# Patient Record
Sex: Female | Born: 1960 | Race: Black or African American | Hispanic: No | Marital: Married | Smoking: Former smoker
Health system: Southern US, Community
[De-identification: ages and names within clinical notes are randomized; demographics above are authoritative.]

## PROBLEM LIST (undated history)

## (undated) DIAGNOSIS — IMO0002 Reserved for concepts with insufficient information to code with codable children: Secondary | ICD-10-CM

## (undated) DIAGNOSIS — E049 Nontoxic goiter, unspecified: Secondary | ICD-10-CM

## (undated) DIAGNOSIS — N762 Acute vulvitis: Secondary | ICD-10-CM

## (undated) DIAGNOSIS — N898 Other specified noninflammatory disorders of vagina: Secondary | ICD-10-CM

## (undated) DIAGNOSIS — N3090 Cystitis, unspecified without hematuria: Secondary | ICD-10-CM

## (undated) DIAGNOSIS — B379 Candidiasis, unspecified: Secondary | ICD-10-CM

## (undated) DIAGNOSIS — K645 Perianal venous thrombosis: Secondary | ICD-10-CM

## (undated) DIAGNOSIS — D219 Benign neoplasm of connective and other soft tissue, unspecified: Secondary | ICD-10-CM

## (undated) DIAGNOSIS — Z87898 Personal history of other specified conditions: Secondary | ICD-10-CM

## (undated) DIAGNOSIS — Z8619 Personal history of other infectious and parasitic diseases: Secondary | ICD-10-CM

## (undated) HISTORY — DX: Cystitis, unspecified without hematuria: N30.90

## (undated) HISTORY — DX: Candidiasis, unspecified: B37.9

## (undated) HISTORY — DX: Benign neoplasm of connective and other soft tissue, unspecified: D21.9

## (undated) HISTORY — DX: Perianal venous thrombosis: K64.5

## (undated) HISTORY — DX: Acute vulvitis: N76.2

## (undated) HISTORY — DX: Personal history of other specified conditions: Z87.898

## (undated) HISTORY — DX: Reserved for concepts with insufficient information to code with codable children: IMO0002

## (undated) HISTORY — DX: Other specified noninflammatory disorders of vagina: N89.8

## (undated) HISTORY — DX: Nontoxic goiter, unspecified: E04.9

## (undated) HISTORY — DX: Personal history of other infectious and parasitic diseases: Z86.19

---
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
## 1985-08-15 DIAGNOSIS — K645 Perianal venous thrombosis: Secondary | ICD-10-CM

## 1985-08-15 HISTORY — DX: Perianal venous thrombosis: K64.5

## 2001-05-11 ENCOUNTER — Emergency Department (HOSPITAL_COMMUNITY): Admission: EM | Admit: 2001-05-11 | Discharge: 2001-05-12 | Payer: Self-pay | Admitting: Emergency Medicine

## 2001-05-12 ENCOUNTER — Encounter: Payer: Self-pay | Admitting: Emergency Medicine

## 2005-04-01 ENCOUNTER — Encounter: Admission: RE | Admit: 2005-04-01 | Discharge: 2005-04-01 | Payer: Self-pay | Admitting: Obstetrics and Gynecology

## 2005-04-01 ENCOUNTER — Other Ambulatory Visit: Admission: RE | Admit: 2005-04-01 | Discharge: 2005-04-01 | Payer: Self-pay | Admitting: Obstetrics and Gynecology

## 2006-01-12 ENCOUNTER — Emergency Department (HOSPITAL_COMMUNITY): Admission: EM | Admit: 2006-01-12 | Discharge: 2006-01-12 | Payer: Self-pay | Admitting: Emergency Medicine

## 2006-01-14 ENCOUNTER — Ambulatory Visit: Payer: Self-pay | Admitting: Family Medicine

## 2006-01-20 ENCOUNTER — Ambulatory Visit: Payer: Self-pay | Admitting: Family Medicine

## 2006-01-28 ENCOUNTER — Encounter: Admission: RE | Admit: 2006-01-28 | Discharge: 2006-04-28 | Payer: Self-pay | Admitting: Family Medicine

## 2006-01-29 ENCOUNTER — Ambulatory Visit: Payer: Self-pay | Admitting: Family Medicine

## 2006-04-02 ENCOUNTER — Encounter: Admission: RE | Admit: 2006-04-02 | Discharge: 2006-04-02 | Payer: Self-pay | Admitting: Obstetrics and Gynecology

## 2006-04-08 ENCOUNTER — Encounter: Admission: RE | Admit: 2006-04-08 | Discharge: 2006-04-08 | Payer: Self-pay | Admitting: Obstetrics and Gynecology

## 2007-06-02 ENCOUNTER — Encounter: Admission: RE | Admit: 2007-06-02 | Discharge: 2007-06-02 | Payer: Self-pay | Admitting: Endocrinology

## 2008-06-02 ENCOUNTER — Encounter: Admission: RE | Admit: 2008-06-02 | Discharge: 2008-06-02 | Payer: Self-pay | Admitting: Endocrinology

## 2008-07-10 DIAGNOSIS — N898 Other specified noninflammatory disorders of vagina: Secondary | ICD-10-CM

## 2008-07-10 DIAGNOSIS — B379 Candidiasis, unspecified: Secondary | ICD-10-CM

## 2008-07-10 HISTORY — DX: Other specified noninflammatory disorders of vagina: N89.8

## 2008-07-10 HISTORY — DX: Candidiasis, unspecified: B37.9

## 2008-08-01 DIAGNOSIS — N762 Acute vulvitis: Secondary | ICD-10-CM

## 2008-08-01 HISTORY — DX: Acute vulvitis: N76.2

## 2009-12-31 ENCOUNTER — Encounter: Admission: RE | Admit: 2009-12-31 | Discharge: 2009-12-31 | Payer: Self-pay | Admitting: Endocrinology

## 2010-12-11 ENCOUNTER — Other Ambulatory Visit: Payer: Self-pay | Admitting: Internal Medicine

## 2010-12-11 DIAGNOSIS — Z1239 Encounter for other screening for malignant neoplasm of breast: Secondary | ICD-10-CM

## 2011-01-01 ENCOUNTER — Ambulatory Visit
Admission: RE | Admit: 2011-01-01 | Discharge: 2011-01-01 | Disposition: A | Discharge status: Home / Self Care | Payer: 59 | Source: Ambulatory Visit | Attending: Internal Medicine | Admitting: Internal Medicine

## 2011-01-01 DIAGNOSIS — Z1239 Encounter for other screening for malignant neoplasm of breast: Secondary | ICD-10-CM

## 2012-01-08 ENCOUNTER — Ambulatory Visit: Payer: 59 | Admitting: Obstetrics and Gynecology

## 2012-01-08 ENCOUNTER — Ambulatory Visit: Payer: Self-pay | Admitting: Obstetrics and Gynecology

## 2012-03-10 ENCOUNTER — Telehealth: Payer: Self-pay | Admitting: Obstetrics and Gynecology

## 2012-03-10 DIAGNOSIS — N63 Unspecified lump in unspecified breast: Secondary | ICD-10-CM

## 2012-03-10 NOTE — Telephone Encounter (Signed)
Routed to chandra 

## 2012-03-11 NOTE — Telephone Encounter (Signed)
Tc to pt per telephone call. Pt c/o swelling of the left breast(? Lump). No nipple d/c. No c/o right breast. Pt due for a mammogram. Order sent to [facility] for a dx mammogram per vph. Pt voices understanding.

## 2012-03-11 NOTE — Telephone Encounter (Signed)
Lm on vm to cb per telephone call.  

## 2012-03-16 ENCOUNTER — Other Ambulatory Visit: Payer: Self-pay | Admitting: Obstetrics and Gynecology

## 2012-03-16 ENCOUNTER — Ambulatory Visit
Admission: RE | Admit: 2012-03-16 | Discharge: 2012-03-16 | Disposition: A | Discharge status: Home / Self Care | Payer: Managed Care, Other (non HMO) | Source: Ambulatory Visit | Attending: Obstetrics and Gynecology | Admitting: Obstetrics and Gynecology

## 2012-03-16 DIAGNOSIS — N63 Unspecified lump in unspecified breast: Secondary | ICD-10-CM

## 2012-05-11 ENCOUNTER — Encounter: Payer: Self-pay | Admitting: Obstetrics and Gynecology

## 2012-05-11 ENCOUNTER — Ambulatory Visit (INDEPENDENT_AMBULATORY_CARE_PROVIDER_SITE_OTHER): Payer: Commercial Indemnity | Admitting: Obstetrics and Gynecology

## 2012-05-11 VITALS — BP 116/66 | Ht 67.75 in | Wt 232.0 lb

## 2012-05-11 DIAGNOSIS — E119 Type 2 diabetes mellitus without complications: Secondary | ICD-10-CM

## 2012-05-11 DIAGNOSIS — E049 Nontoxic goiter, unspecified: Secondary | ICD-10-CM | POA: Insufficient documentation

## 2012-05-11 DIAGNOSIS — N926 Irregular menstruation, unspecified: Secondary | ICD-10-CM | POA: Insufficient documentation

## 2012-05-11 DIAGNOSIS — N979 Female infertility, unspecified: Secondary | ICD-10-CM | POA: Insufficient documentation

## 2012-05-11 MED ORDER — MISOPROSTOL 200 MCG PO TABS: ORAL_TABLET | ORAL | Status: DC

## 2012-05-11 NOTE — Progress Notes (Signed)
AEX  Last Pap: 07/10/2010 WNL: Yes Regular Periods: no.  Some cycles as close as 14 days apart.  Not heavy, just too often Contraception: None   Monthly Breast exam:no Tetanus<32yrs:yes Nl.Bladder Function:yes Daily BMs:yes Healthy Diet:no Calcium:no Mammogram:yes Date of Mammogram: 04/2012 WNL Exercise:yes Have often Exercise: 5 times weekly  Seatbelt: yes Abuse at home: no Stressful work:no Sigmoid-colonoscopy: None Bone Density: No PCP: None Change in PMH: None Change in [facility]: None   Subjective:    Alicia Murray is a 51 y.o. female, G0P0000, who presents for an annual exam. She c/o menstrual irregularity    History   Social History  . Marital Status: Married    Spouse Name: N/A    Number of Children: N/A  . Years of Education: N/A   Social History Main Topics  . Smoking status: Never Smoker   . Smokeless tobacco: Never Used  . Alcohol Use: Yes  . Drug Use: No  . Sexually Active: Yes    Birth Control/ Protection: None   Other Topics Concern  . None   Social History Narrative  . None    Menstrual cycle:   LMP: Patient's last menstrual period was 05/01/2012.           Cycle: 2 year hx of menses q 21-28 days but in last year they have been closer and lasting longer  The following portions of the patient's history were reviewed and updated as appropriate: allergies, current medications, past family history, past medical history, past social history, past surgical history and problem list.  Review of Systems Pertinent items are noted in HPI. Breast:Negative for breast lump,nipple discharge or nipple retraction Gastrointestinal: Negative for abdominal pain, change in bowel habits or rectal bleeding Urinary:negative   Objective:    BP 116/66  Ht 5' 7.75" (1.721 m)  Wt 232 lb (105.235 kg)  BMI 35.54 kg/m2  LMP 05/01/2012    Weight:  Wt Readings from Last 1 Encounters:  05/11/12 232 lb (105.235 kg)          BMI: Body mass index is 35.54  kg/(m^2).  General Appearance: Alert, appropriate appearance for age. No acute distress HEENT: Grossly normal Neck / Thyroid: Supple, no masses, nodes or enlargement Lungs: clear to auscultation bilaterally Back: No CVA tenderness Breast Exam: No masses or nodes.No dimpling, nipple retraction or discharge. Cardiovascular: Regular rate and rhythm. S1, S2, no murmur Gastrointestinal: Soft, non-tender, no masses or organomegaly Pelvic Exam: External genitalia: Right buttock healed incision s/p anal fissure repair Vaginal: atrophic mucosa Cervix: atrophic with stenotic os Adnexa: non palpable Uterus: irregular enlargement and posterior with decreased mobility.   Exam limited by body habitus and uterine size cannont be definitely determined Rectovaginal: normal rectal, no masses Lymphatic Exam: Non-palpable nodes in neck, clavicular, axillary, or inguinal regions Skin: no rash or abnormalities Neurologic: Normal gait and speech, no tremor  Psychiatric: Alert and oriented, appropriate affect.   Wet Prep:not applicable Urinalysis:not applicable UPT: Not done   Assessment:    abnormal menses, possibly perimenopausal  Fibroids, recurrent s/p myomectomy Primary infertility   Plan:    pap smear SH[facility] and endo bx STD screening: declined Contraception:no method ever      Alicia Murray PMD

## 2012-05-12 LAB — PAP IG AND HPV HIGH-RISK: HPV DNA High Risk: NOT DETECTED

## 2012-06-08 ENCOUNTER — Other Ambulatory Visit: Payer: Self-pay | Admitting: Obstetrics and Gynecology

## 2012-06-08 DIAGNOSIS — D219 Benign neoplasm of connective and other soft tissue, unspecified: Secondary | ICD-10-CM

## 2012-06-08 DIAGNOSIS — N926 Irregular menstruation, unspecified: Secondary | ICD-10-CM

## 2012-06-09 ENCOUNTER — Ambulatory Visit (INDEPENDENT_AMBULATORY_CARE_PROVIDER_SITE_OTHER): Payer: Commercial Indemnity | Admitting: Obstetrics and Gynecology

## 2012-06-09 ENCOUNTER — Ambulatory Visit (INDEPENDENT_AMBULATORY_CARE_PROVIDER_SITE_OTHER): Payer: Commercial Indemnity

## 2012-06-09 ENCOUNTER — Other Ambulatory Visit: Payer: Self-pay | Admitting: Obstetrics and Gynecology

## 2012-06-09 ENCOUNTER — Encounter: Payer: Self-pay | Admitting: Obstetrics and Gynecology

## 2012-06-09 VITALS — BP 124/78 | Temp 98.2°F | Wt 233.0 lb

## 2012-06-09 DIAGNOSIS — N926 Irregular menstruation, unspecified: Secondary | ICD-10-CM

## 2012-06-09 DIAGNOSIS — D219 Benign neoplasm of connective and other soft tissue, unspecified: Secondary | ICD-10-CM

## 2012-06-09 DIAGNOSIS — D259 Leiomyoma of uterus, unspecified: Secondary | ICD-10-CM

## 2012-06-09 NOTE — Progress Notes (Signed)
GYN PROBLEM VISIT  Alicia Murray is a 51 y.o. year old female,G0P0000, who presents to followup irregular bleeding from her last visit on May 11, 2012  Subjective:see Last visit  Objective:  BP 124/78  Temp 98.2 F (36.8 C)  Wt 233 lb (105.688 kg)  LMP 05/31/2012   SONOHYSTEROGRAM  Indications for the procedure, risks and benefits have been reviewed.  Questions were answered.   A permit has been signed.  An ultrasound was performed.   The uterus measured 8.56 cm x 7.05 cm.   The endometrial thickness was 6.91 mm.   The ovaries are normal.   PROCEDURE The vagina and cervix were prepped with Betadine.  The sonohysterogram catheter was placed inside the uterus.  20 cc of sterile saline were injected.  A 3-D ultrasound was performed.  Findings include: Anteverted uterus, normal endometrium, normal ovaries, no fluid in CDS, normal adnexas. Fibroids: 1)Fundal intramural 2)Fundal intramural 3)Anterior intramural 4)Posterior intramural  5) Left subserosal  Fibroids No endometrial lesions are seen. Endometrial cavity with equals 3.2 cm length equals 4.6 cm The patient tolerated her procedure well.  All instruments were removed.  The patient was returned to the supine position.  ENDOMETRIAL BIOPSY  LMP: 05/31/12 UPT: Negative  Ultrasound findings: SEE ABOVE Risks and benefits have been discussed  PROCEDURE Cervix prepped with betadine Tenaculum applied to anterior lip of the cervix: yes Uterus sounded at  10  cm Endometrial biopsy easily performed with Pipelle Well tolerated Specimen appropriately identified and sent to pathology  Assessment: Menorrhagia Intermenstrual bleeding Uterine fibroid No endometrial lesion Needs contraception  Plan: Endometrial biopsy performed. Based on results, patient may be offered Mirena IUD, endometrial ablation or definitive surgery with hysterectomy. I would favor Mirena IUD Follow-up: to be determined based on  results   Dierdre Forth,   06/09/2012 6:53 PM

## 2012-06-11 LAB — PATHOLOGY

## 2012-06-16 ENCOUNTER — Telehealth: Payer: Self-pay

## 2012-06-16 NOTE — Telephone Encounter (Signed)
Tc to pt per test results. Told pt [id number] . Pt given recs per vph of tx per last OV;however pt opts to monitor @ this time. Pt will call back if decides to proceed with anything further. Will make vph aware. Pt agrees.

## 2013-07-04 ENCOUNTER — Other Ambulatory Visit: Payer: Self-pay | Admitting: Internal Medicine

## 2013-07-04 DIAGNOSIS — Z1231 Encounter for screening mammogram for malignant neoplasm of breast: Secondary | ICD-10-CM

## 2013-07-21 ENCOUNTER — Ambulatory Visit
Admission: RE | Admit: 2013-07-21 | Discharge: 2013-07-21 | Disposition: A | Discharge status: Home / Self Care | Payer: 59 | Source: Ambulatory Visit | Attending: Internal Medicine | Admitting: Internal Medicine

## 2013-07-21 DIAGNOSIS — Z1231 Encounter for screening mammogram for malignant neoplasm of breast: Secondary | ICD-10-CM

## 2013-07-26 ENCOUNTER — Other Ambulatory Visit: Payer: Self-pay | Admitting: Internal Medicine

## 2013-07-26 DIAGNOSIS — R928 Other abnormal and inconclusive findings on diagnostic imaging of breast: Secondary | ICD-10-CM

## 2013-08-01 ENCOUNTER — Ambulatory Visit
Admission: RE | Admit: 2013-08-01 | Discharge: 2013-08-01 | Disposition: A | Discharge status: Home / Self Care | Payer: 59 | Source: Ambulatory Visit | Attending: Internal Medicine | Admitting: Internal Medicine

## 2013-08-01 ENCOUNTER — Other Ambulatory Visit: Payer: Self-pay | Admitting: Internal Medicine

## 2013-08-01 DIAGNOSIS — R928 Other abnormal and inconclusive findings on diagnostic imaging of breast: Secondary | ICD-10-CM

## 2013-08-08 ENCOUNTER — Other Ambulatory Visit: Payer: Self-pay | Admitting: *Deleted

## 2013-08-08 ENCOUNTER — Ambulatory Visit
Admission: RE | Admit: 2013-08-08 | Discharge: 2013-08-08 | Disposition: A | Discharge status: Home / Self Care | Payer: 59 | Source: Ambulatory Visit | Attending: Internal Medicine | Admitting: Internal Medicine

## 2013-08-08 DIAGNOSIS — E049 Nontoxic goiter, unspecified: Secondary | ICD-10-CM

## 2013-08-08 DIAGNOSIS — E119 Type 2 diabetes mellitus without complications: Secondary | ICD-10-CM

## 2013-08-08 DIAGNOSIS — R928 Other abnormal and inconclusive findings on diagnostic imaging of breast: Secondary | ICD-10-CM

## 2013-08-08 HISTORY — PX: BREAST BIOPSY: SHX20

## 2013-08-10 ENCOUNTER — Other Ambulatory Visit: Payer: Self-pay | Admitting: Endocrinology

## 2013-08-10 DIAGNOSIS — E785 Hyperlipidemia, unspecified: Secondary | ICD-10-CM

## 2013-08-11 ENCOUNTER — Other Ambulatory Visit (INDEPENDENT_AMBULATORY_CARE_PROVIDER_SITE_OTHER): Payer: 59

## 2013-08-11 DIAGNOSIS — E785 Hyperlipidemia, unspecified: Secondary | ICD-10-CM

## 2013-08-11 DIAGNOSIS — E049 Nontoxic goiter, unspecified: Secondary | ICD-10-CM

## 2013-08-11 DIAGNOSIS — E119 Type 2 diabetes mellitus without complications: Secondary | ICD-10-CM

## 2013-08-11 LAB — URINALYSIS
Bilirubin Urine: NEGATIVE
Hgb urine dipstick: NEGATIVE
Ketones, ur: NEGATIVE
Leukocytes, UA: NEGATIVE
Nitrite: NEGATIVE
Specific Gravity, Urine: 1.025 (ref 1.000–1.030)
Total Protein, Urine: NEGATIVE
Urine Glucose: NEGATIVE
Urobilinogen, UA: 0.2 (ref 0.0–1.0)
pH: 6 (ref 5.0–8.0)

## 2013-08-11 LAB — MICROALBUMIN / CREATININE URINE RATIO
Creatinine,U: 135.7 mg/dL
Microalb Creat Ratio: 1.8 mg/g (ref 0.0–30.0)
Microalb, Ur: 2.4 mg/dL — ABNORMAL HIGH (ref 0.0–1.9)

## 2013-08-11 LAB — HEMOGLOBIN A1C: Hgb A1c MFr Bld: 6.3 % (ref 4.6–6.5)

## 2013-08-12 LAB — COMPREHENSIVE METABOLIC PANEL
ALT: 28 U/L (ref 0–35)
AST: 33 U/L (ref 0–37)
Albumin: 4.1 g/dL (ref 3.5–5.2)
Alkaline Phosphatase: 41 U/L (ref 39–117)
BUN: 15 mg/dL (ref 6–23)
CO2: 28 mEq/L (ref 19–32)
Calcium: 9 mg/dL (ref 8.4–10.5)
Chloride: 105 mEq/L (ref 96–112)
Creatinine, Ser: 0.8 mg/dL (ref 0.4–1.2)
GFR: 93.18 mL/min (ref >60.00)
Glucose, Bld: 85 mg/dL (ref 70–99)
Potassium: 4 mEq/L (ref 3.5–5.1)
Sodium: 138 mEq/L (ref 135–145)
Total Bilirubin: 0.5 mg/dL (ref 0.3–1.2)
Total Protein: 7.4 g/dL (ref 6.0–8.3)

## 2013-08-12 LAB — LIPID PANEL
Cholesterol: 141 mg/dL (ref 0–200)
HDL: 46.1 mg/dL (ref >39.00)
LDL Cholesterol: 71 mg/dL (ref 0–99)
Total CHOL/HDL Ratio: 3
Triglycerides: 122 mg/dL (ref 0.0–149.0)
VLDL: 24.4 mg/dL (ref 0.0–40.0)

## 2013-08-12 LAB — TSH: TSH: 1.48 u[IU]/mL (ref 0.35–5.50)

## 2013-08-12 LAB — T4, FREE: Free T4: 0.91 ng/dL (ref 0.60–1.60)

## 2013-08-15 ENCOUNTER — Encounter: Payer: Self-pay | Admitting: Endocrinology

## 2013-08-15 ENCOUNTER — Ambulatory Visit (INDEPENDENT_AMBULATORY_CARE_PROVIDER_SITE_OTHER): Payer: 59 | Admitting: Endocrinology

## 2013-08-15 VITALS — BP 124/70 | HR 61 | Temp 98.4°F | Resp 12 | Ht 68.0 in | Wt 218.0 lb

## 2013-08-15 DIAGNOSIS — E119 Type 2 diabetes mellitus without complications: Secondary | ICD-10-CM | POA: Insufficient documentation

## 2013-08-15 DIAGNOSIS — E785 Hyperlipidemia, unspecified: Secondary | ICD-10-CM | POA: Insufficient documentation

## 2013-08-15 DIAGNOSIS — E1059 Type 1 diabetes mellitus with other circulatory complications: Secondary | ICD-10-CM | POA: Insufficient documentation

## 2013-08-15 DIAGNOSIS — E1051 Type 1 diabetes mellitus with diabetic peripheral angiopathy without gangrene: Secondary | ICD-10-CM | POA: Insufficient documentation

## 2013-08-15 NOTE — Patient Instructions (Signed)
Weight loss meds Qsymia or Belviq  Please check blood sugars at least half the time about 2 hours after any meal and as directed on waking up. Please bring blood sugar monitor to each visit  Exercise daily

## 2013-08-15 NOTE — Progress Notes (Signed)
Patient ID: Alicia Murray, female   DOB: 05/08/1961, 52 y.o.   MRN: [id number]  Alicia Murray is an 52 y.o. female.   Reason for Appointment: Diabetes follow-up   History of Present Illness   Diagnosis: Type 2 DIABETES MELITUS, date of diagnosis:  2007     Previous history: She had significant hyperglycemia diagnosis and was treated with insulin and metformin. However with efforts to lose weight she was able to be treated without medications for sometime. She currently was started on metformin but because of her progressive weight gain she was switched to Victoza with good success and has been on this since 2/14. Currently detailed records are not available  Recent history: Her blood sugars appear to be fairly well controlled even though she has not been exercising at all or trying to watch her diet. She likes to eat sweets and does not watch what she eats although she does try to keep her portions small and is able to keep her weight relatively stable on Victoza. 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          Blood Glucose readings not available Hypoglycemia frequency:  none        Meals: 3 meals per day.          Physical activity: exercise: none                  Complications: are: None  The last HbgA1c was 5.9  Wt Readings from Last 3 Encounters:  08/15/13 218 lb (98.884 kg)  06/09/12 233 lb (105.688 kg)  05/11/12 232 lb (105.235 kg)    LABS:  Appointment on 08/11/2013  Component Date Value Range Status  . Hemoglobin A1C 08/11/2013 6.3  4.6 - 6.5 % Final   Glycemic Control Guidelines for People with Diabetes:Non Diabetic:  <6%Goal of Therapy: <7%Additional Action Suggested:  >8%   . Sodium 08/11/2013 138  135 - 145 mEq/L Final  . Potassium  08/11/2013 4.0  3.5 - 5.1 mEq/L Final  . Chloride 08/11/2013 105  96 - 112 mEq/L Final  . CO2 08/11/2013 28  19 - 32 mEq/L Final  . Glucose, Bld 08/11/2013 85  70 - 99 mg/dL Final  . BUN 40/98/1191 15  6 - 23 mg/dL Final  . Creatinine, Ser 08/11/2013 0.8  0.4 - 1.2 mg/dL Final  . Total Bilirubin 08/11/2013 0.5  0.3 - 1.2 mg/dL Final  . Alkaline Phosphatase 08/11/2013 41  39 - 117 U/L Final  . AST 08/11/2013 33  0 - 37 U/L Final  . ALT 08/11/2013 28  0 - 35 U/L Final  . Total Protein 08/11/2013 7.4  6.0 - 8.3 g/dL Final  . Albumin 47/82/9562 4.1  3.5 - 5.2 g/dL Final  . Calcium 13/06/6577 9.0  8.4 - 10.5 mg/dL Final  . GFR 46/96/2952 93.18  >60.00 mL/min Final  . Microalb, Ur 08/11/2013 2.4* 0.0 - 1.9 mg/dL Final  . Creatinine,U 84/13/2440 135.7   Final  . Microalb Creat Ratio 08/11/2013 1.8  0.0 - 30.0 mg/g Final  . Color, Urine 08/11/2013 LT. YELLOW  Yellow;Lt. Yellow Final  . APPearance 08/11/2013 CLEAR  Clear Final  . Specific Gravity, Urine 08/11/2013 1.025  1.000-1.030 Final  .  pH 08/11/2013 6.0  5.0 - 8.0 Final  . Total Protein, Urine 08/11/2013 NEGATIVE  Negative Final  . Urine Glucose 08/11/2013 NEGATIVE  Negative Final  . Ketones, ur 08/11/2013 NEGATIVE  Negative Final  . Bilirubin Urine 08/11/2013 NEGATIVE  Negative Final  . Hgb urine dipstick 08/11/2013 NEGATIVE  Negative Final  . Urobilinogen, UA 08/11/2013 0.2  0.0 - 1.0 Final  . Leukocytes, UA 08/11/2013 NEGATIVE  Negative Final  . Nitrite 08/11/2013 NEGATIVE  Negative Final  . Free T4 08/11/2013 0.91  0.60 - 1.60 ng/dL Final  . TSH 95/28/4132 1.48  0.35 - 5.50 uIU/mL Final  . Cholesterol 08/11/2013 141  0 - 200 mg/dL Final   ATP III Classification       Desirable:  < 200 mg/dL               Borderline High:  200 - 239 mg/dL          High:  > = 440 mg/dL  . Triglycerides 08/11/2013 122.0  0.0 - 149.0 mg/dL Final   Normal:  <102 mg/dLBorderline High:  150 - 199 mg/dL  . HDL 08/11/2013 46.10  >39.00 mg/dL Final  . VLDL  72/53/6644 24.4  0.0 - 40.0 mg/dL Final  . LDL Cholesterol 08/11/2013 71  0 - 99 mg/dL Final  . Total CHOL/HDL Ratio 08/11/2013 3   Final                  Men          Women1/2 Average Risk     3.4          3.3Average Risk          5.0          4.42X Average Risk          9.6          7.13X Average Risk          15.0          11.0                          Medication List       This list is accurate as of: 08/15/13  9:48 PM.  Always use your most recent med list.               aspirin 325 MG tablet  Take 325 mg by mouth daily.     atorvastatin 20 MG tablet  Commonly known as:  LIPITOR  Take 10 mg by mouth daily.     niacin 500 MG tablet  Commonly known as:  SLO-NIACIN  Take 500 mg by mouth 2 (two) times daily with a meal.     VICTOZA 18 MG/3ML Sopn  Generic drug:  Liraglutide  Inject 1.2 mg into the skin daily.        Allergies: No Known Allergies  Past Medical History  Diagnosis Date  . Thrombosed external hemorrhoid 08/15/85  . H/O varicella   . History of measles, mumps, or rubella   . Diabetes mellitus   . Bladder infection   . Fibroid   . Monilia infection 07/10/08  . Vagina itching 07/10/08  . Vulvitis 08/01/08  . Infertility   . Thyroid enlargement     Past Surgical History  Procedure Laterality Date  . Myomectomy abdominal approach  1977    Family History  Problem Relation Age of Onset  . Cancer Mother  Breast  . Cancer Father     Brain  . Hypertension Brother     Social History:  reports that she has never smoked. She has never used smokeless tobacco. She reports that  drinks alcohol. She reports that she does not use illicit drugs.  Review of Systems:  Hypertension:  has not been treated for this  Lipids: Has had diabetic dyslipidemia with increased particle number and triglycerides, has been on combination with statins and niacin which she is taking regularly now      Examination:   BP 124/70  Pulse 61  Temp(Src) 98.4 F (36.9 C)   Resp 12  Ht 5\' 8"  (1.727 m)  Wt 218 lb (98.884 kg)  BMI 33.15 kg/m2  SpO2 98%  LMP 05/24/2013  Body mass index is 33.15 kg/(m^2).    ASSESSMENT/ PLAN::   Diabetes type 2   Blood glucose control appears fairly good with upper normal A1c and good lab glucose. However she has poor motivation to follow her diet, check her blood sugars or get back into exercise. Will continue her regimen unchanged Rarely her PCP is planning to put her on a weight-loss medication but not clear which one. Discussed that she could go off her Victoza if she is able to get back into exercise, better diet compliance and start losing weight  Hyperlipidemia: She has been compliant with her medications now with using generics and lipids appear excellent. Will continue the same regimen   Lenox Bink 08/15/2013, 9:48 PM

## 2013-11-17 ENCOUNTER — Other Ambulatory Visit: Payer: Self-pay | Admitting: *Deleted

## 2013-11-17 MED ORDER — ATORVASTATIN CALCIUM 20 MG PO TABS: 10.0000 mg | ORAL_TABLET | Freq: Every day | ORAL | Status: DC

## 2013-12-14 ENCOUNTER — Other Ambulatory Visit (INDEPENDENT_AMBULATORY_CARE_PROVIDER_SITE_OTHER): Payer: 59

## 2013-12-14 DIAGNOSIS — E119 Type 2 diabetes mellitus without complications: Secondary | ICD-10-CM

## 2013-12-14 LAB — BASIC METABOLIC PANEL
BUN: 14 mg/dL (ref 6–23)
CO2: 27 mEq/L (ref 19–32)
Calcium: 8.8 mg/dL (ref 8.4–10.5)
Chloride: 102 mEq/L (ref 96–112)
Creatinine, Ser: 0.8 mg/dL (ref 0.4–1.2)
GFR: 94.36 mL/min (ref >60.00)
Glucose, Bld: 82 mg/dL (ref 70–99)
Potassium: 3.9 mEq/L (ref 3.5–5.1)
Sodium: 136 mEq/L (ref 135–145)

## 2013-12-14 LAB — HEMOGLOBIN A1C: Hgb A1c MFr Bld: 6 % (ref 4.6–6.5)

## 2013-12-19 ENCOUNTER — Ambulatory Visit (INDEPENDENT_AMBULATORY_CARE_PROVIDER_SITE_OTHER): Payer: 59 | Admitting: Endocrinology

## 2013-12-19 ENCOUNTER — Other Ambulatory Visit: Payer: Self-pay | Admitting: *Deleted

## 2013-12-19 ENCOUNTER — Encounter: Payer: Self-pay | Admitting: Endocrinology

## 2013-12-19 VITALS — BP 124/72 | HR 84 | Temp 98.2°F | Resp 14 | Ht 68.0 in | Wt 208.4 lb

## 2013-12-19 DIAGNOSIS — E785 Hyperlipidemia, unspecified: Secondary | ICD-10-CM

## 2013-12-19 DIAGNOSIS — E119 Type 2 diabetes mellitus without complications: Secondary | ICD-10-CM

## 2013-12-19 MED ORDER — ATORVASTATIN CALCIUM 10 MG PO TABS
10.0000 mg | ORAL_TABLET | Freq: Every day | ORAL | Status: DC
Start: 2013-12-19 — End: 2014-08-23

## 2013-12-19 NOTE — Progress Notes (Signed)
Patient ID: Alicia Murray, female   DOB: Oct 15, 1961, 53 y.o.   MRN: [id number]   Reason for Appointment: Diabetes follow-up   History of Present Illness   Diagnosis: Type 2 DIABETES MELITUS, date of diagnosis:  2007     Previous history: She had significant hyperglycemia diagnosis and was treated with insulin and metformin. However with efforts to lose weight she was able to be treated without medications for sometime. She currently was started on metformin but because of her progressive weight gain she was switched to Victoza with good success and has been on this since 2/14. Currently detailed records are not available  Recent history: Her blood sugars are well controlled even though she has not been exercising recently or trying to watch her diet.  However she has lost weight using phentermine. She does not think this helps her appetite but is now eating 2 meals a day instead of 3 with her work schedule She likes to eat sweets and this has not changed despite continuing Victoza and using phentermine Also has not been motivated to check her blood sugar except a couple of times in the last month     Side effects from medications: None       Monitors blood glucose:  minimal recently   Glucometer: One Touch.          Blood Glucose readings 92, 97 Hypoglycemia frequency:  none        Meals: 2 meal per day.          Physical activity: exercise: none (knee)                  Complications: are: None  Eye exam 1/14    Wt Readings from Last 3 Encounters:  12/19/13 208 lb 6.4 oz (94.53 kg)  08/15/13 218 lb (98.884 kg)  06/09/12 233 lb (105.688 kg)    LABS:  Lab Results  Component Value Date   HGBA1C 6.0 12/14/2013   HGBA1C 6.3 08/11/2013   Lab Results  Component Value Date   MICROALBUR 2.4* 08/11/2013   LDLCALC 71 08/11/2013   CREATININE 0.8 12/14/2013    Appointment on 12/14/2013  Component Date Value Range Status  . Hemoglobin A1C 12/14/2013 6.0  4.6 - 6.5 % Final   Glycemic  Control Guidelines for People with Diabetes:Non Diabetic:  <6%Goal of Therapy: <7%Additional Action Suggested:  >8%   . Sodium 12/14/2013 136  135 - 145 mEq/L Final  . Potassium 12/14/2013 3.9  3.5 - 5.1 mEq/L Final  . Chloride 12/14/2013 102  96 - 112 mEq/L Final  . CO2 12/14/2013 27  19 - 32 mEq/L Final  . Glucose, Bld 12/14/2013 82  70 - 99 mg/dL Final  . BUN 12/14/2013 14  6 - 23 mg/dL Final  . Creatinine, Ser 12/14/2013 0.8  0.4 - 1.2 mg/dL Final  . Calcium 12/14/2013 8.8  8.4 - 10.5 mg/dL Final  . GFR 12/14/2013 94.36  >60.00 mL/min Final      Medication List       This list is accurate as of: 12/19/13  4:30 PM.  Always use your most recent med list.               aspirin 325 MG tablet  Take 325 mg by mouth daily.     atorvastatin 20 MG tablet  Commonly known as:  LIPITOR  Take 0.5 tablets (10 mg total) by mouth daily.     B-COMPLEX-C PO  Take by mouth.  Fish Oil 1000 MG Caps  Take by mouth.     naproxen 500 MG tablet  Commonly known as:  NAPROSYN  Take 500 mg by mouth 2 (two) times daily with a meal.     niacin 500 MG tablet  Commonly known as:  SLO-NIACIN  Take 500 mg by mouth 2 (two) times daily with a meal.     phentermine 37.5 MG capsule  Take 37.5 mg by mouth every morning.     VICTOZA 18 MG/3ML Sopn  Generic drug:  Liraglutide  Inject 1.2 mg into the skin daily.     Vitamin D3 2000 UNITS Tabs  Take by mouth.     vitamin E 400 UNIT capsule  Generic drug:  vitamin E  Take 400 Units by mouth daily.        Allergies: No Known Allergies  Past Medical History  Diagnosis Date  . Thrombosed external hemorrhoid 08/15/85  . H/O varicella   . History of measles, mumps, or rubella   . Diabetes mellitus   . Bladder infection   . Fibroid   . Monilia infection 07/10/08  . Vagina itching 07/10/08  . Vulvitis 08/01/08  . Infertility   . Thyroid enlargement     Past Surgical History  Procedure Laterality Date  . Myomectomy abdominal approach   1977    Family History  Problem Relation Age of Onset  . Cancer Mother     Breast  . Cancer Father     Brain  . Hypertension Brother     Social History:  reports that she has never smoked. She has never used smokeless tobacco. She reports that she drinks alcohol. She reports that she does not use illicit drugs.  Review of Systems:  Hypertension:  no history of this  Lipids: Has had diabetic dyslipidemia with increased particle number and triglycerides, has been on combination with statins and niacin 500 mg which she is taking regularly now. No recent LDL particle number done  Lab Results  Component Value Date   CHOL 141 08/11/2013   HDL 46.10 08/11/2013   LDLCALC 71 08/11/2013   TRIG 122.0 08/11/2013   CHOLHDL 3 08/11/2013        Examination:   BP 124/72  Pulse 84  Temp(Src) 98.2 F (36.8 C)  Resp 14  Ht 5\' 8"  (1.727 m)  Wt 208 lb 6.4 oz (94.53 kg)  BMI 31.69 kg/m2  SpO2 96%  LMP 12/16/2013  Body mass index is 31.69 kg/(m^2).    ASSESSMENT/ PLAN::   Diabetes type 2   Blood glucose control has been excellent with upper normal A1c Although she has lost weight not clear if this is from using phentermine; She is still eating sweets and has not changed her portions She appears to have faster pulse rate phentermine and she will discuss stopping this with [facility] Encouraged her to start exercising again when her knee joint pain is better She also needs to check her blood sugar more regularly  Hyperlipidemia: She has been compliant with her medications now with using generic drugs. Will continue the same regimen but also check her LDL particle number on followup   Abubakr Wieman 12/19/2013, 4:30 PM

## 2013-12-19 NOTE — Patient Instructions (Signed)
Please check blood sugars at least half the time about 2 hours after any meal and as directed on waking up. Please bring blood sugar monitor to each visit

## 2014-01-04 ENCOUNTER — Encounter (HOSPITAL_COMMUNITY): Payer: Self-pay | Admitting: Emergency Medicine

## 2014-01-04 ENCOUNTER — Emergency Department (HOSPITAL_COMMUNITY)

## 2014-01-04 ENCOUNTER — Emergency Department (HOSPITAL_COMMUNITY)
Admission: EM | Admit: 2014-01-04 | Discharge: 2014-01-04 | Disposition: A | Discharge status: Home / Self Care | Attending: Emergency Medicine | Admitting: Emergency Medicine

## 2014-01-04 DIAGNOSIS — Z8742 Personal history of other diseases of the female genital tract: Secondary | ICD-10-CM | POA: Diagnosis not present

## 2014-01-04 DIAGNOSIS — Z8679 Personal history of other diseases of the circulatory system: Secondary | ICD-10-CM | POA: Diagnosis not present

## 2014-01-04 DIAGNOSIS — Z79899 Other long term (current) drug therapy: Secondary | ICD-10-CM | POA: Diagnosis not present

## 2014-01-04 DIAGNOSIS — Z7982 Long term (current) use of aspirin: Secondary | ICD-10-CM | POA: Diagnosis not present

## 2014-01-04 DIAGNOSIS — Y9389 Activity, other specified: Secondary | ICD-10-CM | POA: Diagnosis not present

## 2014-01-04 DIAGNOSIS — S9780XA Crushing injury of unspecified foot, initial encounter: Secondary | ICD-10-CM | POA: Insufficient documentation

## 2014-01-04 DIAGNOSIS — S8990XA Unspecified injury of unspecified lower leg, initial encounter: Secondary | ICD-10-CM | POA: Diagnosis present

## 2014-01-04 DIAGNOSIS — Z8619 Personal history of other infectious and parasitic diseases: Secondary | ICD-10-CM | POA: Diagnosis not present

## 2014-01-04 DIAGNOSIS — Z87448 Personal history of other diseases of urinary system: Secondary | ICD-10-CM | POA: Diagnosis not present

## 2014-01-04 DIAGNOSIS — Z87891 Personal history of nicotine dependence: Secondary | ICD-10-CM | POA: Insufficient documentation

## 2014-01-04 DIAGNOSIS — Y990 Civilian activity done for income or pay: Secondary | ICD-10-CM | POA: Diagnosis not present

## 2014-01-04 DIAGNOSIS — W208XXA Other cause of strike by thrown, projected or falling object, initial encounter: Secondary | ICD-10-CM | POA: Diagnosis not present

## 2014-01-04 DIAGNOSIS — Z791 Long term (current) use of non-steroidal anti-inflammatories (NSAID): Secondary | ICD-10-CM | POA: Diagnosis not present

## 2014-01-04 DIAGNOSIS — Y9289 Other specified places as the place of occurrence of the external cause: Secondary | ICD-10-CM | POA: Diagnosis not present

## 2014-01-04 DIAGNOSIS — E119 Type 2 diabetes mellitus without complications: Secondary | ICD-10-CM | POA: Diagnosis not present

## 2014-01-04 MED ORDER — OXYCODONE-ACETAMINOPHEN 5-325 MG PO TABS: 2.0000 | ORAL_TABLET | ORAL | Status: DC | PRN

## 2014-01-04 MED ORDER — IBUPROFEN 600 MG PO TABS: 600.0000 mg | ORAL_TABLET | Freq: Four times a day (QID) | ORAL | Status: DC | PRN

## 2014-01-04 MED ORDER — IBUPROFEN 800 MG PO TABS
800.0000 mg | ORAL_TABLET | Freq: Once | ORAL | Status: AC
  Administered 2014-01-04: 800 mg via ORAL
  Filled 2014-01-04: qty 1

## 2014-01-04 MED ORDER — OXYCODONE-ACETAMINOPHEN 5-325 MG PO TABS
2.0000 | ORAL_TABLET | Freq: Once | ORAL | Status: AC
  Administered 2014-01-04: 2 via ORAL
  Filled 2014-01-04: qty 2

## 2014-01-04 NOTE — ED Provider Notes (Signed)
CSN: [id number]     Arrival date & time 01/04/14  0059 History   First MD Initiated Contact with Patient 01/04/14 0211     Chief Complaint  Patient presents with  . Foot Pain     (Consider location/radiation/quality/duration/timing/severity/associated sxs/prior Treatment) HPI History provided by patient. Left foot injury tonight while at work. States a heavy piece of equipment rolled over her left foot. She complains of moderate to severe sharp pain, unable to bear weight. No bleeding or bruising. She does have swelling over the dorsum of her foot involving the lateral malleolus. She denies any knee or hip injury. No associated weakness or numbness. No history of foot injuries in the past.  Past Medical History  Diagnosis Date  . Thrombosed external hemorrhoid 08/15/85  . H/O varicella   . History of measles, mumps, or rubella   . Diabetes mellitus   . Bladder infection   . Fibroid   . Monilia infection 07/10/08  . Vagina itching 07/10/08  . Vulvitis 08/01/08  . Infertility   . Thyroid enlargement    Past Surgical History  Procedure Laterality Date  . Myomectomy abdominal approach  1977   Family History  Problem Relation Age of Onset  . Cancer Mother     Breast  . Cancer Father     Brain  . Hypertension Brother    History  Substance Use Topics  . Smoking status: Former Research scientist (life sciences)  . Smokeless tobacco: Never Used  . Alcohol Use: No   OB History   Grav Para Term Preterm Abortions TAB SAB Ect Mult Living   0 0 0 0 0 0 0 0 0 0      Review of Systems  Skin: Positive for wound.  Neurological: Negative for weakness and numbness.  All other systems reviewed and are negative.      Allergies  Review of patient's allergies indicates no known allergies.  Home Medications   Current Outpatient Rx  Name  Route  Sig  Dispense  Refill  . aspirin 325 MG tablet   Oral   Take 325 mg by mouth daily.         Marland Kitchen atorvastatin (LIPITOR) 10 MG tablet   Oral   Take 1 tablet (10  mg total) by mouth daily.   30 tablet   5   . B-COMPLEX-C PO   Oral   Take by mouth.         . Cholecalciferol (VITAMIN D3) 2000 UNITS TABS   Oral   Take by mouth.         . Liraglutide (VICTOZA) 18 MG/3ML SOPN   Subcutaneous   Inject 1.2 mg into the skin daily.         . naproxen (NAPROSYN) 500 MG tablet   Oral   Take 500 mg by mouth 2 (two) times daily with a meal.         . niacin (SLO-NIACIN) 500 MG tablet   Oral   Take 500 mg by mouth 2 (two) times daily with a meal.         . Omega-3 Fatty Acids (FISH OIL) 1000 MG CAPS   Oral   Take by mouth.         . phentermine 37.5 MG capsule   Oral   Take 37.5 mg by mouth every morning.         . vitamin E (VITAMIN E) 400 UNIT capsule   Oral   Take 400 Units by mouth daily.  BP 134/81  Pulse 77  Temp(Src) 98.1 F (36.7 C) (Oral)  Resp 18  SpO2 99%  LMP 01/04/2014 Physical Exam  Constitutional: She is oriented to person, place, and time. She appears well-developed and well-nourished.  HENT:  Head: Normocephalic and atraumatic.  Eyes: EOM are normal. Pupils are equal, round, and reactive to light.  Neck: Neck supple.  Cardiovascular: Regular rhythm and intact distal pulses.   Pulmonary/Chest: Effort normal. No respiratory distress.  Musculoskeletal:  Left lower extremity with tenderness and swelling over the dorsum of the foot and also somewhat over the lateral malleolus. Skin is intact throughout. Good range of motion at ankle, foot and toes. Distal neurovascular is intact. No tenderness over the proximal fibula. No tenderness of the knee or hip. No other extremity injuries.  Neurological: She is alert and oriented to person, place, and time.  Skin: Skin is warm and dry.    ED Course  Procedures (including critical care time) Labs Review Labs Reviewed - No data to display Imaging Review Dg Ankle Complete Left  01/04/2014   CLINICAL DATA:  Heavy object rolled over patient's left foot,  with pain at the dorsal foot and ankle.  EXAM: LEFT ANKLE COMPLETE - 3+ VIEW  COMPARISON:  None.  FINDINGS: There is no evidence of fracture or dislocation. The ankle mortise is intact; the interosseous space is within normal limits. No talar tilt or subluxation is seen. A posterior calcaneal spur is noted. An os peroneum is seen.  The joint spaces are preserved. No significant soft tissue abnormalities are seen.  IMPRESSION: 1. No evidence of fracture or dislocation. 2. Os peroneum noted.   Electronically Signed   By: Garald Balding M.D.   On: 01/04/2014 01:59   Dg Foot Complete Left  01/04/2014   CLINICAL DATA:  Heavy object rolled over patient's left foot; dorsal foot pain.  EXAM: LEFT FOOT - COMPLETE 3+ VIEW  COMPARISON:  None.  FINDINGS: There is no evidence of fracture or dislocation. The joint spaces are preserved. There is no evidence of talar subluxation; the subtalar joint is unremarkable in appearance. An os peroneum is noted. A posterior calcaneal spur is seen.  No significant soft tissue abnormalities are seen.  IMPRESSION: No evidence of fracture or dislocation.   Electronically Signed   By: Garald Balding M.D.   On: 01/04/2014 02:00    Ice. Motrin. Percocet.  Postop shoe provided  Plan discharge home with outpatient followup. Referral to occupational medicine provided if patient does not have one provided by her employer.  Crush injury precautions verbalizes understood.  MDM   Diagnosis: Crush injury left foot  Evaluated with x-rays as above, no apparent bony fractures Treat with pain medications  Vital signs nursing notes reviewed and considered    Teressa Lower, MD 01/04/14 [phone]

## 2014-01-04 NOTE — Discharge Instructions (Signed)
Crush Injury, Fingers or Toes  A crush injury to the fingers or toes means the tissues have been damaged by being squeezed (compressed). There will be bleeding into the tissues and swelling. Often, blood will collect under the skin. When this happens, the skin on the finger often dies and may slough off (shed) 1 week to 10 days later. Usually, new skin is growing underneath. If the injury has been too severe and the tissue does not survive, the damaged tissue may begin to turn black over several days.   Wounds which occur because of the crushing may be stitched (sutured) shut. However, crush injuries are more likely to become infected than other injuries. These wounds may not be closed as tightly as other types of cuts to prevent infection. Nails involved are often lost. These usually grow back over several weeks.   DIAGNOSIS  X-rays may be taken to see if there is any injury to the bones.  TREATMENT  Broken bones (fractures) may be treated with splinting, depending on the fracture. Often, no treatment is required for fractures of the last bone in the fingers or toes.  HOME CARE INSTRUCTIONS   · The crushed part should be raised (elevated) above the heart or center of the chest as much as possible for the first several days or as directed. This helps with pain and lessens swelling. Less swelling increases the chances that the crushed part will survive.  · Put ice on the injured area.  · Put ice in a plastic bag.  · Place a towel between your skin and the bag.  · Leave the ice on for 15-20 minutes, 03-04 times a day for the first 2 days.  · Only take over-the-counter or prescription medicines for pain, discomfort, or fever as directed by your caregiver.  · Use your injured part only as directed.  · Change your bandages (dressings) as directed.  · Keep all follow-up appointments as directed by your caregiver. Not keeping your appointment could result in a chronic or permanent injury, pain, and disability. If there is  any problem keeping the appointment, you must call to reschedule.  SEEK IMMEDIATE MEDICAL CARE IF:   · There is redness, swelling, or increasing pain in the wound area.  · Pus is coming from the wound.  · You have a fever.  · You notice a bad smell coming from the wound or dressing.  · The edges of the wound do not stay together after the sutures have been removed.  · You are unable to move the injured finger or toe.  MAKE SURE YOU:   · Understand these instructions.  · Will watch your condition.  · Will get help right away if you are not doing well or get worse.  Document Released: 10/27/2005 Document Revised: 01/19/2012 Document Reviewed: 03/14/2011  [employer]® Patient Information ©2014 [employer].

## 2014-01-04 NOTE — ED Notes (Signed)
Ice Pack given for left foot

## 2014-01-04 NOTE — ED Notes (Signed)
Patient states that she got her left foot stuck under a piece of rolling equipment at work

## 2014-03-27 ENCOUNTER — Other Ambulatory Visit: Payer: Self-pay | Admitting: *Deleted

## 2014-03-27 MED ORDER — FLUTICASONE PROPIONATE 50 MCG/ACT NA SUSP: 1.0000 | Freq: Every day | NASAL | Status: DC

## 2014-04-13 ENCOUNTER — Other Ambulatory Visit (INDEPENDENT_AMBULATORY_CARE_PROVIDER_SITE_OTHER): Payer: 59

## 2014-04-13 DIAGNOSIS — E119 Type 2 diabetes mellitus without complications: Secondary | ICD-10-CM

## 2014-04-13 DIAGNOSIS — E785 Hyperlipidemia, unspecified: Secondary | ICD-10-CM

## 2014-04-13 LAB — MICROALBUMIN / CREATININE URINE RATIO
Creatinine,U: 175.9 mg/dL
Microalb Creat Ratio: 2.7 mg/g (ref 0.0–30.0)
Microalb, Ur: 4.7 mg/dL — ABNORMAL HIGH (ref 0.0–1.9)

## 2014-04-13 LAB — COMPREHENSIVE METABOLIC PANEL
ALT: 22 U/L (ref 0–35)
AST: 29 U/L (ref 0–37)
Albumin: 4.1 g/dL (ref 3.5–5.2)
Alkaline Phosphatase: 40 U/L (ref 39–117)
BUN: 14 mg/dL (ref 6–23)
CO2: 29 mEq/L (ref 19–32)
Calcium: 9.1 mg/dL (ref 8.4–10.5)
Chloride: 103 mEq/L (ref 96–112)
Creatinine, Ser: 0.9 mg/dL (ref 0.4–1.2)
GFR: 90.41 mL/min (ref >60.00)
Glucose, Bld: 96 mg/dL (ref 70–99)
Potassium: 3.8 mEq/L (ref 3.5–5.1)
Sodium: 137 mEq/L (ref 135–145)
Total Bilirubin: 0.4 mg/dL (ref 0.2–1.2)
Total Protein: 7.3 g/dL (ref 6.0–8.3)

## 2014-04-13 LAB — LIPID PANEL
Cholesterol: 145 mg/dL (ref 0–200)
HDL: 51.9 mg/dL (ref >39.00)
LDL Cholesterol: 78 mg/dL (ref 0–99)
NonHDL: 93.1
Total CHOL/HDL Ratio: 3
Triglycerides: 77 mg/dL (ref 0.0–149.0)
VLDL: 15.4 mg/dL (ref 0.0–40.0)

## 2014-04-13 LAB — HEMOGLOBIN A1C: Hgb A1c MFr Bld: 6.3 % (ref 4.6–6.5)

## 2014-04-14 LAB — LIPOPROTEIN ANALYSIS BY NMR
HDL Particle Number: 41.9 umol/L (ref >=30.5)
LDL Particle Number: 996 nmol/L (ref <1000)
LDL Size: 21 nm (ref >20.5)
LP-IR Score: 69 — ABNORMAL HIGH (ref <=45)
Small LDL Particle Number: 419 nmol/L (ref <=527)

## 2014-04-17 ENCOUNTER — Ambulatory Visit (INDEPENDENT_AMBULATORY_CARE_PROVIDER_SITE_OTHER): Payer: 59 | Admitting: Endocrinology

## 2014-04-17 ENCOUNTER — Encounter: Payer: Self-pay | Admitting: Endocrinology

## 2014-04-17 VITALS — BP 124/76 | HR 67 | Temp 97.9°F | Resp 16 | Ht 66.0 in | Wt 214.6 lb

## 2014-04-17 DIAGNOSIS — E049 Nontoxic goiter, unspecified: Secondary | ICD-10-CM

## 2014-04-17 DIAGNOSIS — E785 Hyperlipidemia, unspecified: Secondary | ICD-10-CM

## 2014-04-17 DIAGNOSIS — E119 Type 2 diabetes mellitus without complications: Secondary | ICD-10-CM

## 2014-04-17 DIAGNOSIS — G5600 Carpal tunnel syndrome, unspecified upper limb: Secondary | ICD-10-CM

## 2014-04-17 NOTE — Progress Notes (Signed)
Patient ID: Alicia Murray, female   DOB: 26-Jun-1961, 53 y.o.   MRN: [id number]   Reason for Appointment: Diabetes follow-up   History of Present Illness   Diagnosis: Type 2 DIABETES MELITUS, date of diagnosis:  2007     Previous history: She had significant hyperglycemia diagnosis and was treated with insulin and metformin. However with efforts to lose weight she was able to be treated without medications for sometime. She currently was started on metformin but because of her progressive weight gain she was switched to Victoza with good success and has been on this since 2/14. Currently detailed records are not available  Recent history: She has gained check her blood sugars very infrequently Overall compliance with diet and exercise is not any better and she has gained back 6 pounds Previously was on phentermine and is not taking this now She likes to eat sweets and this has not changed despite continuing Victoza She was previously exercising right after getting off her work late at night but is now wanting to switch back to the gym that is open late at night  Also has not been motivated to improve her diet     Side effects from medications: None       Monitors blood glucose:  minimal recently   Glucometer: One Touch.          Blood Glucose readings : Has 2 readings of 101 Hypoglycemia frequency:  none         Meals: 2 meal per day.          Physical activity: exercise: none               Complications: are: None  Eye exam 1/14    Wt Readings from Last 3 Encounters:  04/17/14 214 lb 9.6 oz (97.342 kg)  12/19/13 208 lb 6.4 oz (94.53 kg)  08/15/13 218 lb (98.884 kg)    LABS:  Lab Results  Component Value Date   HGBA1C 6.3 04/13/2014   HGBA1C 6.0 12/14/2013   HGBA1C 6.3 08/11/2013   Lab Results  Component Value Date   MICROALBUR 4.7* 04/13/2014   LDLCALC 78 04/13/2014   CREATININE 0.9 04/13/2014    Appointment on 04/13/2014  Component Date Value Ref Range Status  . LDL  Particle Number 04/13/2014 [id number]  <1000 nmol/L Final   Comment:                           Low                   < 1000                                                    Moderate         1000 - 1299                                                    Borderline-High  1300 - 1599  High             1600 - 2000                                                    Very High             > 2000  . HDL Particle Number 04/13/2014 [id number]  >=30.5 umol/L Final  . Small LDL Particle Number 04/13/2014 [id number]  <=527 nmol/L Final  . LDL Size 04/13/2014 21.0  >20.5 nm Final   Comment:  ----------------------------------------------------------                                           ** INTERPRETATIVE INFORMATION**                                           PARTICLE CONCENTRATION AND SIZE                                              <--Lower CVD Risk   Higher CVD Risk-->                            LDL AND HDL PARTICLES   Percentile in Reference Population                            HDL-P (total)        High     75th    50th    25th   Low                                                 >34.9    34.9    30.5    26.7   <26.7                            Small LDL-P          Low      25th    50th    75th   High                                                 <117     117     527     839    >839                            LDL Size   <-Large (Pattern A)->    <-Small (Pattern B)->  23.0    20.6           20.5      19.0                           ----------------------------------------------------------                          Small LDL-P and LDL Size are associated with CVD risk, but not after                          LDL-P is taken into account.                          These assays were developed and their performance characteristics                          determined by [employer]. These assays have not been cleared by the                           [place] Food and Drug Administration. The clinical utility of these                          laboratory values have not been fully established.  Marland Kitchen LP-IR Score 04/13/2014 69* <=45 Final   Comment: INSULIN RESISTANCE MARKER                              <--Insulin Sensitive    Insulin Resistant-->                                     Percentile in Reference Population                          Insulin Resistance Score                          LP-IR Score   Low   25th   50th   75th   High                                        <27   27     45     63     >63                          LP-IR Score is inaccurate if patient is non-fasting.                          The LP-IR score is a laboratory developed index that has been                          associated with insulin resistance and diabetes risk and should be  used as one component of a physician's clinical assessment. The                          LP-IR score listed above has not been cleared by the [place] Food and                          Drug Administration.  . Hemoglobin A1C 04/13/2014 6.3  4.6 - 6.5 % Final   Glycemic Control Guidelines for People with Diabetes:Non Diabetic:  <6%Goal of Therapy: <7%Additional Action Suggested:  >8%   . Sodium 04/13/2014 137  135 - 145 mEq/L Final  . Potassium 04/13/2014 3.8  3.5 - 5.1 mEq/L Final  . Chloride 04/13/2014 103  96 - 112 mEq/L Final  . CO2 04/13/2014 29  19 - 32 mEq/L Final  . Glucose, Bld 04/13/2014 96  70 - 99 mg/dL Final  . BUN 04/13/2014 14  6 - 23 mg/dL Final  . Creatinine, Ser 04/13/2014 0.9  0.4 - 1.2 mg/dL Final  . Total Bilirubin 04/13/2014 0.4  0.2 - 1.2 mg/dL Final  . Alkaline Phosphatase 04/13/2014 40  39 - 117 U/L Final  . AST 04/13/2014 29  0 - 37 U/L Final  . ALT 04/13/2014 22  0 - 35 U/L Final  . Total Protein 04/13/2014 7.3  6.0 - 8.3 g/dL Final  . Albumin 04/13/2014 4.1  3.5 - 5.2 g/dL Final  . Calcium 04/13/2014 9.1  8.4 - 10.5 mg/dL Final   . GFR 04/13/2014 90.41  >60.00 mL/min Final  . Microalb, Ur 04/13/2014 4.7* 0.0 - 1.9 mg/dL Final  . Creatinine,U 04/13/2014 175.9   Final  . Microalb Creat Ratio 04/13/2014 2.7  0.0 - 30.0 mg/g Final  . Cholesterol 04/13/2014 145  0 - 200 mg/dL Final   ATP III Classification       Desirable:  < 200 mg/dL               Borderline High:  200 - 239 mg/dL          High:  > = 240 mg/dL  . Triglycerides 04/13/2014 77.0  0.0 - 149.0 mg/dL Final   Normal:  <150 mg/dLBorderline High:  150 - 199 mg/dL  . HDL 04/13/2014 51.90  >39.00 mg/dL Final  . VLDL 04/13/2014 15.4  0.0 - 40.0 mg/dL Final  . LDL Cholesterol 04/13/2014 78  0 - 99 mg/dL Final  . Total CHOL/HDL Ratio 04/13/2014 3   Final                  Men          Women1/2 Average Risk     3.4          3.3Average Risk          5.0          4.42X Average Risk          9.6          7.13X Average Risk          15.0          11.0                      . NonHDL 04/13/2014 93.10   Final      Medication List       This list is accurate as of: 04/17/14  4:12 PM.  Always use  your most recent med list.               aspirin 325 MG tablet  Take 325 mg by mouth daily.     atorvastatin 10 MG tablet  Commonly known as:  LIPITOR  Take 1 tablet (10 mg total) by mouth daily.     B-COMPLEX-C PO  Take by mouth.     Fish Oil 1000 MG Caps  Take by mouth.     fluticasone 50 MCG/ACT nasal spray  Commonly known as:  FLONASE  Place 1 spray into both nostrils daily.     ibuprofen 600 MG tablet  Commonly known as:  ADVIL,MOTRIN  Take 1 tablet (600 mg total) by mouth every 6 (six) hours as needed.     naproxen 500 MG tablet  Commonly known as:  NAPROSYN  Take 500 mg by mouth 2 (two) times daily with a meal.     niacin 500 MG tablet  Commonly known as:  SLO-NIACIN  Take 500 mg by mouth 2 (two) times daily with a meal.     oxyCODONE-acetaminophen 5-325 MG per tablet  Commonly known as:  PERCOCET/ROXICET  Take 2 tablets by mouth every 4 (four)  hours as needed for severe pain.     phentermine 37.5 MG capsule  Take 37.5 mg by mouth every morning.     VICTOZA 18 MG/3ML Sopn  Generic drug:  Liraglutide  Inject 1.2 mg into the skin daily.     Vitamin D3 2000 UNITS Tabs  Take by mouth.     vitamin E 400 UNIT capsule  Generic drug:  vitamin E  Take 400 Units by mouth daily.        Allergies: No Known Allergies  Past Medical History  Diagnosis Date  . Thrombosed external hemorrhoid 08/15/85  . H/O varicella   . History of measles, mumps, or rubella   . Diabetes mellitus   . Bladder infection   . Fibroid   . Monilia infection 07/10/08  . Vagina itching 07/10/08  . Vulvitis 08/01/08  . Infertility   . Thyroid enlargement     Past Surgical History  Procedure Laterality Date  . Myomectomy abdominal approach  1977    Family History  Problem Relation Age of Onset  . Cancer Mother     Breast  . Cancer Father     Brain  . Hypertension Brother     Social History:  reports that she has quit smoking. She has never used smokeless tobacco. She reports that she does not drink alcohol or use illicit drugs.  Review of Systems:  Hypertension:  not present  Lipids: Has had diabetic dyslipidemia with increased particle number and triglycerides, has been on combination with statins and OTC niacin 500 mg twice a day which she is taking regularly now. No side effects from this. Her recent LDL particle number  is below 100  Lab Results  Component Value Date   CHOL 145 04/13/2014   HDL 51.90 04/13/2014   LDLCALC 78 04/13/2014   TRIG 77.0 04/13/2014   CHOLHDL 3 04/13/2014   She is asking about numbness in her right hand which is getting progressively worse. This is mostly in the fingers and does have some pain which may sometimes radiate up the forearm or even upper arm She does have some more symptoms at night She has tried a brace which has not helped. Previously has had carpal tunnel syndrome on the left side which had been  treated conservatively  No numbness or tingling in her feet   Examination:   BP 124/76  Pulse 67  Temp(Src) 97.9 F (36.6 C)  Resp 16  Ht 5\' 6"  (1.676 m)  Wt 214 lb 9.6 oz (97.342 kg)  BMI 34.65 kg/m2  SpO2 97%  Body mass index is 34.65 kg/(m^2).   Thyroid is enlarged about twice normal, smooth and slightly firm Tinel sign negative on the right Brachioradialis r and biceps reflexes are normal No pedal edema  ASSESSMENT/ PLAN:   Diabetes type 2   Blood glucose control has been excellent with upper normal A1c Her main difficulties are poor compliance with self care and has not exercised as before She has difficulty getting herself motivated to do anything including checking her blood sugars and following the diet However it is probably still benefiting from [place] and her glucose is well controlled Recommendations made:  She will join the gym that is open late at night when she gets off work  More glucose monitoring including after meals  Start watching diet and portions of high carbohydrate and high sugar foods  Also establish with a PCP for regular care  Hyperlipidemia: She has been compliant with her medications now with using generic drugs. Will continue the same regimen since her particle number is excellent now despite her inconsistent diet and weight gain  Probable carpal tunnel syndrome on the right. She is fairly symptomatic and discussed that she needs further evaluation and referred her to a Copy. May need nerve conduction studies also   Goiter: Has not had a thyroid level checked recently. Will check TSH and it may be playing a role in her carpal tunnel syndrome  Elayne Snare 04/17/2014, 4:12 PM

## 2014-04-18 LAB — TSH: TSH: 1.02 u[IU]/mL (ref 0.35–4.50)

## 2014-05-19 ENCOUNTER — Other Ambulatory Visit: Payer: Self-pay | Admitting: *Deleted

## 2014-05-19 MED ORDER — LIRAGLUTIDE 18 MG/3ML ~~LOC~~ SOPN: 1.2000 mg | PEN_INJECTOR | Freq: Every day | SUBCUTANEOUS | Status: DC

## 2014-05-26 ENCOUNTER — Other Ambulatory Visit: Payer: Self-pay | Admitting: *Deleted

## 2014-05-26 ENCOUNTER — Telehealth: Payer: Self-pay | Admitting: Endocrinology

## 2014-05-26 MED ORDER — DULAGLUTIDE 0.75 MG/0.5ML ~~LOC~~ SOAJ: SUBCUTANEOUS | Status: DC

## 2014-05-26 NOTE — Telephone Encounter (Signed)
Does she have a $25 co-pay card. If so she needs to find out if her insurance will be better for Trulicity or  Bydureon

## 2014-05-26 NOTE — Telephone Encounter (Signed)
Patient would like to know if there is anything that can be done with her rx Victoza; is too expensive   [facility]    Thank you

## 2014-05-26 NOTE — Telephone Encounter (Signed)
Please advise 

## 2014-05-26 NOTE — Telephone Encounter (Signed)
She has been using the copay card for Victoza for the last 2 years, she's no longer able to use it.  She wanted me to call in Trulicity to see if insurance would cover that. RX sent

## 2014-07-31 ENCOUNTER — Other Ambulatory Visit: Payer: Self-pay

## 2014-07-31 DIAGNOSIS — Z1231 Encounter for screening mammogram for malignant neoplasm of breast: Secondary | ICD-10-CM

## 2014-08-07 ENCOUNTER — Ambulatory Visit
Admission: RE | Admit: 2014-08-07 | Discharge: 2014-08-07 | Disposition: A | Discharge status: Home / Self Care | Payer: Managed Care, Other (non HMO) | Source: Ambulatory Visit

## 2014-08-07 DIAGNOSIS — Z1231 Encounter for screening mammogram for malignant neoplasm of breast: Secondary | ICD-10-CM

## 2014-08-23 ENCOUNTER — Other Ambulatory Visit: Payer: Self-pay | Admitting: Endocrinology

## 2014-08-25 ENCOUNTER — Other Ambulatory Visit: Payer: Self-pay | Admitting: *Deleted

## 2014-08-25 MED ORDER — ATORVASTATIN CALCIUM 10 MG PO TABS: ORAL_TABLET | ORAL | Status: DC

## 2014-08-25 MED ORDER — LIRAGLUTIDE 18 MG/3ML ~~LOC~~ SOPN: 1.2000 mg | PEN_INJECTOR | Freq: Every day | SUBCUTANEOUS | Status: DC

## 2014-09-08 ENCOUNTER — Emergency Department (HOSPITAL_COMMUNITY): Payer: Managed Care, Other (non HMO)

## 2014-09-08 ENCOUNTER — Encounter (HOSPITAL_COMMUNITY): Payer: Self-pay | Admitting: Emergency Medicine

## 2014-09-08 ENCOUNTER — Emergency Department (HOSPITAL_COMMUNITY)
Admission: EM | Admit: 2014-09-08 | Discharge: 2014-09-08 | Disposition: A | Discharge status: Nursing Home (Includes ICF) | Payer: Managed Care, Other (non HMO) | Source: Home / Self Care | Attending: Family Medicine | Admitting: Family Medicine

## 2014-09-08 ENCOUNTER — Emergency Department (HOSPITAL_COMMUNITY)
Admission: EM | Admit: 2014-09-08 | Discharge: 2014-09-09 | Disposition: A | Discharge status: Home / Self Care | Payer: Managed Care, Other (non HMO) | Attending: Emergency Medicine | Admitting: Emergency Medicine

## 2014-09-08 DIAGNOSIS — Z862 Personal history of diseases of the blood and blood-forming organs and certain disorders involving the immune mechanism: Secondary | ICD-10-CM | POA: Insufficient documentation

## 2014-09-08 DIAGNOSIS — Z872 Personal history of diseases of the skin and subcutaneous tissue: Secondary | ICD-10-CM | POA: Diagnosis not present

## 2014-09-08 DIAGNOSIS — E119 Type 2 diabetes mellitus without complications: Secondary | ICD-10-CM | POA: Insufficient documentation

## 2014-09-08 DIAGNOSIS — Z79899 Other long term (current) drug therapy: Secondary | ICD-10-CM | POA: Insufficient documentation

## 2014-09-08 DIAGNOSIS — Z7982 Long term (current) use of aspirin: Secondary | ICD-10-CM | POA: Diagnosis not present

## 2014-09-08 DIAGNOSIS — Z87448 Personal history of other diseases of urinary system: Secondary | ICD-10-CM | POA: Diagnosis not present

## 2014-09-08 DIAGNOSIS — Z791 Long term (current) use of non-steroidal anti-inflammatories (NSAID): Secondary | ICD-10-CM | POA: Insufficient documentation

## 2014-09-08 DIAGNOSIS — Z8619 Personal history of other infectious and parasitic diseases: Secondary | ICD-10-CM | POA: Insufficient documentation

## 2014-09-08 DIAGNOSIS — M542 Cervicalgia: Secondary | ICD-10-CM

## 2014-09-08 DIAGNOSIS — M6520 Calcific tendinitis, unspecified site: Secondary | ICD-10-CM

## 2014-09-08 DIAGNOSIS — Z87891 Personal history of nicotine dependence: Secondary | ICD-10-CM | POA: Diagnosis not present

## 2014-09-08 LAB — CBC
HCT: 37.8 % (ref 36.0–46.0)
Hemoglobin: 12.5 g/dL (ref 12.0–15.0)
MCH: 28.6 pg (ref 26.0–34.0)
MCHC: 33.1 g/dL (ref 30.0–36.0)
MCV: 86.5 fL (ref 78.0–100.0)
Platelets: 286 10*3/uL (ref 150–400)
RBC: 4.37 MIL/uL (ref 3.87–5.11)
RDW: 13.2 % (ref 11.5–15.5)
WBC: 8.1 10*3/uL (ref 4.0–10.5)

## 2014-09-08 LAB — BASIC METABOLIC PANEL
Anion gap: 12 (ref 5–15)
BUN: 11 mg/dL (ref 6–23)
CO2: 25 mEq/L (ref 19–32)
Calcium: 8.8 mg/dL (ref 8.4–10.5)
Chloride: 99 mEq/L (ref 96–112)
Creatinine, Ser: 0.67 mg/dL (ref 0.50–1.10)
GFR calc Af Amer: >90 mL/min (ref >90)
GFR calc non Af Amer: >90 mL/min (ref >90)
Glucose, Bld: 80 mg/dL (ref 70–99)
Potassium: 3.9 mEq/L (ref 3.7–5.3)
Sodium: 136 mEq/L — ABNORMAL LOW (ref 137–147)

## 2014-09-08 MED ORDER — SODIUM CHLORIDE 0.9 % IV BOLUS (SEPSIS)
1000.0000 mL | Freq: Once | INTRAVENOUS | Status: AC
  Administered 2014-09-08: 1000 mL via INTRAVENOUS

## 2014-09-08 MED ORDER — IOHEXOL 300 MG/ML  SOLN
80.0000 mL | Freq: Once | INTRAMUSCULAR | Status: AC | PRN
  Administered 2014-09-08: 80 mL via INTRAVENOUS

## 2014-09-08 MED ORDER — MORPHINE SULFATE 4 MG/ML IJ SOLN
4.0000 mg | Freq: Once | INTRAMUSCULAR | Status: AC
  Administered 2014-09-08: 4 mg via INTRAVENOUS
  Filled 2014-09-08: qty 1

## 2014-09-08 NOTE — ED Provider Notes (Signed)
CSN: [id number]     Arrival date & time 09/08/14  1552 History   First MD Initiated Contact with Patient 09/08/14 1634     Chief Complaint  Patient presents with  . Torticollis   (Consider location/radiation/quality/duration/timing/severity/associated sxs/prior Treatment) Patient is a 53 y.o. female presenting with neck injury.  Neck Injury This is a new problem. The current episode started 2 days ago (gradual onset while at work, nki, no neuro sx, having pain with swallowing nad unable to turn head.no fever.). The problem occurs constantly. The problem has not changed since onset.Associated symptoms include headaches. Pertinent negatives include no chest pain, no abdominal pain and no shortness of breath.    Past Medical History  Diagnosis Date  . Thrombosed external hemorrhoid 08/15/85  . H/O varicella   . History of measles, mumps, or rubella   . Diabetes mellitus   . Bladder infection   . Fibroid   . Monilia infection 07/10/08  . Vagina itching 07/10/08  . Vulvitis 08/01/08  . Infertility   . Thyroid enlargement    Past Surgical History  Procedure Laterality Date  . Myomectomy abdominal approach  1977   Family History  Problem Relation Age of Onset  . Cancer Mother     Breast  . Cancer Father     Brain  . Hypertension Brother    History  Substance Use Topics  . Smoking status: Former Research scientist (life sciences)  . Smokeless tobacco: Never Used  . Alcohol Use: No   OB History   Grav Para Term Preterm Abortions TAB SAB Ect Mult Living   0 0 0 0 0 0 0 0 0 0      Review of Systems  Constitutional: Negative.   Respiratory: Negative for shortness of breath.   Cardiovascular: Negative for chest pain.  Gastrointestinal: Negative for abdominal pain.  Musculoskeletal: Positive for neck pain and neck stiffness.  Skin: Negative.   Neurological: Positive for headaches. Negative for dizziness, weakness, light-headedness and numbness.    Allergies  Review of patient's allergies indicates no  known allergies.  Home Medications   Prior to Admission medications   Medication Sig Start Date End Date Taking? Authorizing Provider  atorvastatin (LIPITOR) 10 MG tablet TAKE 1 TABLET (10 MG TOTAL) BY MOUTH DAILY. 08/25/14  Yes Elayne Snare, MD  Liraglutide (VICTOZA) 18 MG/3ML SOPN Inject 1.2 mg into the skin daily. 08/25/14  Yes Elayne Snare, MD  aspirin 325 MG tablet Take 325 mg by mouth daily.    Historical Provider, MD  B-COMPLEX-C PO Take by mouth.    Historical Provider, MD  Cholecalciferol (VITAMIN D3) 2000 UNITS TABS Take by mouth.    Historical Provider, MD  Dulaglutide (TRULICITY) 0.96 KR/8.3KF SOPN Inject one pen per week 05/26/14   Elayne Snare, MD  fluticasone (FLONASE) 50 MCG/ACT nasal spray Place 1 spray into both nostrils daily. 03/27/14   Elayne Snare, MD  ibuprofen (ADVIL,MOTRIN) 600 MG tablet Take 1 tablet (600 mg total) by mouth every 6 (six) hours as needed. 01/04/14   Teressa Lower, MD  naproxen (NAPROSYN) 500 MG tablet Take 500 mg by mouth 2 (two) times daily with a meal.    Historical Provider, MD  niacin (SLO-NIACIN) 500 MG tablet Take 500 mg by mouth 2 (two) times daily with a meal.    Historical Provider, MD  Omega-3 Fatty Acids (FISH OIL) 1000 MG CAPS Take by mouth.    Historical Provider, MD  oxyCODONE-acetaminophen (PERCOCET/ROXICET) 5-325 MG per tablet Take 2 tablets by mouth every 4 (  four) hours as needed for severe pain. 01/04/14   Teressa Lower, MD  phentermine 37.5 MG capsule Take 37.5 mg by mouth every morning.    Historical Provider, MD  vitamin E (VITAMIN E) 400 UNIT capsule Take 400 Units by mouth daily.    Historical Provider, MD   BP 139/88  Pulse 68  Temp(Src) 98.7 F (37.1 C) (Oral)  Resp 18  SpO2 97% Physical Exam  Nursing note and vitals reviewed. Constitutional: She is oriented to person, place, and time. She appears well-developed and well-nourished. She appears distressed.  HENT:  Head: Normocephalic.  Right Ear: External ear normal.  Left Ear:  External ear normal.  Mouth/Throat: Oropharynx is clear and moist.  Neck: Trachea normal. Spinous process tenderness and muscular tenderness present. Decreased range of motion present.  Lymphadenopathy:    She has no cervical adenopathy.  Neurological: She is alert and oriented to person, place, and time.  Skin: Skin is warm and dry.    ED Course  Procedures (including critical care time) Labs Review Labs Reviewed - No data to display  Imaging Review No results found.   MDM   1. Neck pain, bilateral        Billy Fischer, MD 09/08/14 [phone]

## 2014-09-08 NOTE — ED Provider Notes (Signed)
CSN: [id number]     Arrival date & time 09/08/14  1719 History   First MD Initiated Contact with Patient 09/08/14 2050     Chief Complaint  Patient presents with  . Neck Pain     (Consider location/radiation/quality/duration/timing/severity/associated sxs/prior Treatment) The history is provided by the patient. No language interpreter was used.  Alicia Murray is a 53 y/o F with PMHx of DM, thyroid enlargement presenting to the ED with neck pain that started on Wednesday with increased pain for the past couple of days. Stated that the pain is worse with rotation of the head described as a pulling sensation. Patient reported that she has been using Ibuprofen for the past couple of days - stated that she took 800 mg of Ibuprofen yesterday and stated that she was able to sleep. Stated that she has been having muscle spasms over the past couple of days that has increased today. Stated that she has discomfort when she swallows - reported mild throat closing sensation. Stated that prior to arrival patient was seen and assessed at [facility] who recommended patient to come to the ED for further work up and imaging to be performed. Denied fever, chills, chest pain, shortness of breath, difficulty breathing, blurred vision, sudden loss of vision, photosensitivity, abdominal pain, nausea, vomiting, diarrhea, numbness, tingling, loss of sensation, jaw pain, inability to open and close the mouth, fall, head injury. PCP none  Past Medical History  Diagnosis Date  . Thrombosed external hemorrhoid 08/15/85  . H/O varicella   . History of measles, mumps, or rubella   . Diabetes mellitus   . Bladder infection   . Fibroid   . Monilia infection 07/10/08  . Vagina itching 07/10/08  . Vulvitis 08/01/08  . Infertility   . Thyroid enlargement    Past Surgical History  Procedure Laterality Date  . Myomectomy abdominal approach  1977   Family History  Problem Relation Age of Onset  . Cancer  Mother     Breast  . Cancer Father     Brain  . Hypertension Brother    History  Substance Use Topics  . Smoking status: Former Research scientist (life sciences)  . Smokeless tobacco: Never Used  . Alcohol Use: No   OB History   Grav Para Term Preterm Abortions TAB SAB Ect Mult Living   0 0 0 0 0 0 0 0 0 0      Review of Systems  Constitutional: Negative for fever and chills.  Respiratory: Negative for chest tightness and shortness of breath.   Cardiovascular: Negative for chest pain.  Gastrointestinal: Negative for nausea and vomiting.  Musculoskeletal: Positive for myalgias, neck pain and neck stiffness. Negative for back pain.  Neurological: Negative for dizziness, weakness, numbness and headaches.      Allergies  Review of patient's allergies indicates no known allergies.  Home Medications   Prior to Admission medications   Medication Sig Start Date End Date Taking? Authorizing Provider  aspirin 325 MG tablet Take 325 mg by mouth daily.   Yes Historical Provider, MD  atorvastatin (LIPITOR) 10 MG tablet Take 10 mg by mouth daily. 08/25/14  Yes Elayne Snare, MD  B-COMPLEX-C PO Take 1 tablet by mouth daily.    Yes Historical Provider, MD  Cholecalciferol (VITAMIN D3) 2000 UNITS TABS Take 1 tablet by mouth daily.    Yes Historical Provider, MD  Dulaglutide (TRULICITY) 9.37 JI/9.6VE SOPN Inject one pen per week 05/26/14  Yes Elayne Snare, MD  fluticasone (FLONASE) 50 MCG/ACT nasal  spray Place 1 spray into both nostrils daily. 03/27/14  Yes Elayne Snare, MD  ibuprofen (ADVIL,MOTRIN) 600 MG tablet Take 1 tablet (600 mg total) by mouth every 6 (six) hours as needed. 01/04/14  Yes Teressa Lower, MD  Liraglutide (VICTOZA) 18 MG/3ML SOPN Inject 1.2 mg into the skin daily. 08/25/14  Yes Elayne Snare, MD  naproxen (NAPROSYN) 500 MG tablet Take 500 mg by mouth daily as needed for mild pain.    Yes Historical Provider, MD  niacin (SLO-NIACIN) 500 MG tablet Take 500 mg by mouth 2 (two) times daily with a meal.   Yes  Historical Provider, MD  Omega-3 Fatty Acids (FISH OIL) 1000 MG CAPS Take 1 capsule by mouth daily.    Yes Historical Provider, MD  oxyCODONE-acetaminophen (PERCOCET/ROXICET) 5-325 MG per tablet Take 2 tablets by mouth every 4 (four) hours as needed for severe pain. 01/04/14  Yes Teressa Lower, MD  terbinafine (LAMISIL) 250 MG tablet Take 250 mg by mouth daily.   Yes Historical Provider, MD  vitamin E (VITAMIN E) 400 UNIT capsule Take 400 Units by mouth daily.   Yes Historical Provider, MD  oxyCODONE-acetaminophen (PERCOCET/ROXICET) 5-325 MG per tablet Take 1 tablet by mouth every 8 (eight) hours as needed for moderate pain or severe pain. 09/09/14   Coby Antrobus, PA-C   BP 127/72  Pulse 76  Temp(Src) 98.2 F (36.8 C) (Oral)  Resp 14  SpO2 97% Physical Exam  Nursing note and vitals reviewed. Constitutional: She is oriented to person, place, and time. She appears well-developed and well-nourished. No distress.  HENT:  Head: Normocephalic and atraumatic.  Negative trismus  Eyes: Conjunctivae and EOM are normal. Pupils are equal, round, and reactive to light. Right eye exhibits no discharge. Left eye exhibits no discharge.  Neck: Neck supple. Spinous process tenderness and muscular tenderness present. No rigidity. Decreased range of motion (secondary to pain ) present. No tracheal deviation, no edema and no erythema present.    Negative neck swelling  Negative cervical lymphadenopathy  Discomfort upon palpation to the musculature of the neck bilaterally   Cardiovascular: Normal rate, regular rhythm and normal heart sounds.  Exam reveals no friction rub.   No murmur heard. Pulmonary/Chest: Effort normal and breath sounds normal. No respiratory distress. She has no wheezes. She has no rales.  Patient is able to speak in full sentences without difficulty  Negative use of accessory muscles Negative stridor  Musculoskeletal:  Full ROM to upper and lower extremities without difficulty noted,  negative ataxia noted.  Lymphadenopathy:    She has no cervical adenopathy.  Neurological: She is alert and oriented to person, place, and time. No cranial nerve deficit. She exhibits normal muscle tone. Coordination normal.  Cranial nerves III-XII grossly intact Strength 5+/5+ to upper and lower extremities bilaterally with resistance applied, equal distribution noted Equal grip strength bilaterally  Negative facial drooping Negative slurred speech  Negative aphasia Patient is able to bring finger to nose bilaterally without difficulty or ataxia Negative arm drift Fine motor skills intact Patient follows commands well  Patient responds to questions appropriately  Skin: Skin is warm and dry. No rash noted. She is not diaphoretic. No erythema.  Psychiatric: She has a normal mood and affect. Her behavior is normal. Thought content normal.    ED Course  Procedures (including critical care time) Labs Review Labs Reviewed  BASIC METABOLIC PANEL - Abnormal; Notable for the following:    Sodium 136 (*)    All other components within normal  limits  CBC    Imaging Review Dg Cervical Spine Complete  09/08/2014   CLINICAL DATA:  Right-sided neck pain that radiates to the head.  EXAM: CERVICAL SPINE  4+ VIEWS  COMPARISON:  None.  FINDINGS: There is no evidence of cervical spine fracture or prevertebral soft tissue swelling. Alignment is normal. No other significant bone abnormalities are identified.  IMPRESSION: Essentially normal exam.   Electronically Signed   By: Rozetta Nunnery M.D.   On: 09/08/2014 23:52   Ct Soft Tissue Neck W Contrast  09/09/2014   CLINICAL DATA:  Neck stiffness beginning Wednesday, difficulty swallowing. No fever.  EXAM: CT NECK WITH CONTRAST  TECHNIQUE: Multidetector CT imaging of the neck was performed using the standard protocol following the bolus administration of intravenous contrast.  CONTRAST:  59mL OMNIPAQUE IOHEXOL 300 MG/ML  SOLN  COMPARISON:  Cervical spine  radiographs September 08, 2014  FINDINGS: Cervical vertebral bodies and posterior elements are intact and aligned with straightened cervical lordosis. 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 Trace retropharyngeal effusion without abscess.  Aerodigestive tract is otherwise unremarkable. Apposed vocal cord suggests Valsalva. No fluid collections within the neck. Normal appearance of major salivary glands. No lymphadenopathy by CT size criteria. Normal appearance of cervical vessels.  Enlarged heterogeneous thyroid without dominant nodule. Included view of the lung apices are clear.  No paranasal sinus air-fluid levels. 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 Status post RIGHT ocular globe scleral banding.  IMPRESSION: Findings suggest acute calcific tendinitis of the longus colli muscle with trace retropharyngeal effusion, no abscess. Patent airway.  Thyromegaly.   Electronically Signed   By: Elon Alas   On: 09/09/2014 00:18     EKG Interpretation None     10:47 PM patient seen and assessed by attending physician, Dr. Lyn Hollingshead. Recommended CT soft tissue of neck with contrast to be performed.   12:31 AM This provider reassess the patient. Patient reported the pain has mildly improved.  MDM   Final diagnoses:  Neck pain  Calcific tendinitis    Medications  sodium chloride 0.9 % bolus 1,000 mL (0 mLs Intravenous Stopped 09/09/14 0010)  morphine 4 MG/ML injection 4 mg (4 mg Intravenous Given 09/08/14 2257)  iohexol (OMNIPAQUE) 300 MG/ML solution 80 mL (80 mLs Intravenous Contrast Given 09/08/14 2343)    Filed Vitals:   09/09/14 0002 09/09/14 0015 09/09/14 0030 09/09/14 0045  BP: 119/68 121/67 118/67 127/72  Pulse: 79 74 79 76  Temp:      TempSrc:      Resp:      SpO2: 98% 97% 97% 97%    CBC negative elevated white blood cell count. BMP unremarkable. Glucose within normal limits with  negative elevation anion gap. Cervical plan film negative. CT soft tissue with contrast identified acute calcific tendinitis of the longus coli muscle with trace retropharyngeal effusion. Negative findings of retropharyngeal abscess. Negative meningeal signs. Doubt DKA. Negative findings of peritonsillar abscess. Negative findings of retropharyngeal abscess. Patient presenting to the emergency department with tendinitis as noted on CT a soft tissue neck with contrast. Patient given IV fluids and pain medications in the setting with positive relief. Patient tolerated fluids by mouth without difficulty. Patient seen and assessed by attending physician who agrees to plan of discharge. Patient stable, afebrile. Patient had septic appearing. Negative focal neurological deficits. Negative signs of respiratory distress. Discharged patient. Discharge patient with pain medications-discuss course, cautions, disposable technique. Discussed with patient to  avoid any physical or strenuous activity. Referred patient to PCP and orthopedics. Discussed with patient to apply heat and massage. Discussed with patient to closely monitor symptoms and if symptoms are to worsen or change to report back to the ED - strict return instructions given.  Patient agreed to plan of care, understood, all questions answered.   Jamse Mead, PA-C 09/09/14 [phone]

## 2014-09-08 NOTE — ED Notes (Signed)
Pt remains in CT/XRAY

## 2014-09-08 NOTE — ED Notes (Signed)
PA at bedside.

## 2014-09-08 NOTE — ED Notes (Signed)
C/o stiff neck onset Wednesday Sx also include: odynophagia; pain increases w/activity Denies inj/trauma Alert, no signs of acute distress.

## 2014-09-08 NOTE — ED Notes (Signed)
Pt leaving for x-ray/ct

## 2014-09-08 NOTE — ED Provider Notes (Signed)
Complains of bilateral nonradiating neck pain, feels like spasm worse with moving her neck from side to side improved with remaining still and worse with swallowing accompanied by pain on swallowing which started 2 days ago. No fever. Treated herself with ibuprofen today with partial relief. Pain is moderate to severe no chest pain no shortness of breath no fever no other associated symptoms  Orlie Dakin, MD 09/08/14 2247

## 2014-09-08 NOTE — ED Notes (Signed)
Reports neck pain and stiffness since wed. Pt sent here from [facility] for further eval. Denies any fever or sensitivity to light but did have some n/v/d and has pain when swallowing. Denies injury. Pt ambulatory at triage. Airway intact.

## 2014-09-09 MED ORDER — OXYCODONE-ACETAMINOPHEN 5-325 MG PO TABS: 1.0000 | ORAL_TABLET | Freq: Three times a day (TID) | ORAL | Status: DC | PRN

## 2014-09-09 NOTE — ED Provider Notes (Signed)
Medical screening examination/treatment/procedure(s) were conducted as a shared visit with non-physician practitioner(s) and myself.  I personally evaluated the patient during the encounter.   EKG Interpretation None       Orlie Dakin, MD 09/09/14 0100

## 2014-09-09 NOTE — Discharge Instructions (Signed)
Please call your doctor for a followup appointment within 24-48 hours. When you talk to your doctor please let them know that you were seen in the emergency department and have them acquire all of your records so that they can discuss the findings with you and formulate a treatment plan to fully care for your new and ongoing problems. Please call and set-up an appointment with Orthopedics Please avoid any physical or strenuous activity - please no heavy lifting Please rest and stay hydrated Please massage and apply heat Please take medications as prescribed - while on pain medications there is to be no drinking alcohol, driving, operating any heavy machinery. If extra please dispose in a proper manner. Please do not take any extra Tylenol with this medication for this can lead to Tylenol overdose and liver issues.  Please continue to monitor symptoms closely and if symptoms are to worsen or change (fever greater than 101, chills, sweating, nausea, vomiting, chest pain, shortness of breathe, difficulty breathing, weakness, numbness, tingling, worsening or changes to pain pattern, swelling to the neck, fall, injury, tingling or numbness to the arms bilaterally) please report back to the Emergency Department immediately.    Calcific Tendinitis Calcific tendinitis occurs when crystals of calcium are deposited in a tendon. Tendons are bands of strong, fibrous tissue that attach muscles to bones. Tendons are an important part of joints. They make the joint move and they absorb some of the stress that a joint receives during use. When calcium is deposited in the tendon, the tendon becomes stiff, painful, and it can become swollen. Calcific tendinitis occurs frequently in the shoulder joint, in a structure called the rotator cuff. CAUSES  The cause of calcific tendinitis is unclear. It may be associated with:  Overuse of the tendon, such as from repetitive motion.  Excess stress on the  tendon.  Aging.  Repetitive, mild injuries. SYMPTOMS   Pain may or may not be present. If it is present, it may occur when moving the joint.  Tenderness when pressure is applied to the tendon.  A snapping or popping sound when the joint moves.  Decreased motion of the joint.  Difficulty sleeping due to pain in the joint. DIAGNOSIS  Your health care provider will perform a physical exam. Imaging tests may also be used to make the diagnosis. These may include X-rays, an MRI, or a CT scan. TREATMENT  Generally, calcific tendinitis resolves on its own. Treatment for pain of calcific tendinitis may include:  Taking over-the-counter medicines, such as anti-inflammatory drugs.  Applying ice packs to the joint.  Following a specific exercise program to keep the joint working properly.  Attending physical therapy sessions.  Avoiding activities that cause pain. Treatment for more severe calcific tendinitis may require:  Injecting cortisone steroids or pain relieving medicines into or around the joint.  Manipulating the joint after you are given medicine to numb the area (local anesthetic).  Inflating the joint with sterile fluid to increase the flexibility of the tendons.  Shockwave therapy, which involves focusing sound waves on the joint. If other treatments do not work, surgery may be done to clean out the calcium deposits and repair the tendons where needed. Most people do not need surgery. HOME CARE INSTRUCTIONS   Only take over-the-counter or prescription medicines for pain, fever, or discomfort as directed by your health care provider.  Follow your health care provider's recommendations for activity and exercise. SEEK MEDICAL CARE IF:  You notice an increase in pain or numbness.  You develop new weakness.  You notice increased joint stiffness or a sensation of looseness in the joint.  You notice increasing redness, swelling, or warmth around the joint area. SEEK  IMMEDIATE MEDICAL CARE IF:  You have a fever or persistent symptoms for more than 2 to 3 days.  You have a fever and your symptoms suddenly get worse. MAKE SURE YOU:  Understand these instructions.  Will watch your condition.  Will get help right away if you are not doing well or get worse. Document Released: 08/05/2008 Document Revised: 03/13/2014 Document Reviewed: 02/05/2012 [facility] Patient Information 2015 [place], [place]. This information is not intended to replace advice given to you by your health care provider. Make sure you discuss any questions you have with your health care provider.  Cervical Strain and Sprain (Whiplash) with Rehab Cervical strain and sprain are injuries that commonly occur with "whiplash" injuries. Whiplash occurs when the neck is forcefully whipped backward or forward, such as during a motor vehicle accident or during contact sports. The muscles, ligaments, tendons, discs, and nerves of the neck are susceptible to injury when this occurs. RISK FACTORS Risk of having a whiplash injury increases if:  Osteoarthritis of the spine.  Situations that make head or neck accidents or trauma more likely.  High-risk sports (football, rugby, wrestling, hockey, auto racing, gymnastics, diving, contact karate, or boxing).  Poor strength and flexibility of the neck.  Previous neck injury.  Poor tackling technique.  Improperly fitted or padded equipment. SYMPTOMS   Pain or stiffness in the front or back of neck or both.  Symptoms may present immediately or up to 24 hours after injury.  Dizziness, headache, nausea, and vomiting.  Muscle spasm with soreness and stiffness in the neck.  Tenderness and swelling at the injury site. PREVENTION  Learn and use proper technique (avoid tackling with the head, spearing, and head-butting; use proper falling techniques to avoid landing on the head).  Warm up and stretch properly before activity.  Maintain physical  fitness:  Strength, flexibility, and endurance.  Cardiovascular fitness.  Wear properly fitted and padded protective equipment, such as padded soft collars, for participation in contact sports. PROGNOSIS  Recovery from cervical strain and sprain injuries is dependent on the extent of the injury. These injuries are usually curable in 1 week to 3 months with appropriate treatment.  RELATED COMPLICATIONS   Temporary numbness and weakness may occur if the nerve roots are damaged, and this may persist until the nerve has completely healed.  Chronic pain due to frequent recurrence of symptoms.  Prolonged healing, especially if activity is resumed too soon (before complete recovery). TREATMENT  Treatment initially involves the use of ice and medication to help reduce pain and inflammation. It is also important to perform strengthening and stretching exercises and modify activities that worsen symptoms so the injury does not get worse. These exercises may be performed at home or with a therapist. For patients who experience severe symptoms, a soft, padded collar may be recommended to be worn around the neck.  Improving your posture may help reduce symptoms. Posture improvement includes pulling your chin and abdomen in while sitting or standing. If you are sitting, sit in a firm chair with your buttocks against the back of the chair. While sleeping, try replacing your pillow with a small towel rolled to 2 inches in diameter, or use a cervical pillow or soft cervical collar. Poor sleeping positions delay healing.  For patients with nerve root damage, which causes numbness  or weakness, the use of a cervical traction apparatus may be recommended. Surgery is rarely necessary for these injuries. However, cervical strain and sprains that are present at birth (congenital) may require surgery. MEDICATION   If pain medication is necessary, nonsteroidal anti-inflammatory medications, such as aspirin and  ibuprofen, or other minor pain relievers, such as acetaminophen, are often recommended.  Do not take pain medication for 7 days before surgery.  Prescription pain relievers may be given if deemed necessary by your caregiver. Use only as directed and only as much as you need. HEAT AND COLD:   Cold treatment (icing) relieves pain and reduces inflammation. Cold treatment should be applied for 10 to 15 minutes every 2 to 3 hours for inflammation and pain and immediately after any activity that aggravates your symptoms. Use ice packs or an ice massage.  Heat treatment may be used prior to performing the stretching and strengthening activities prescribed by your caregiver, physical therapist, or athletic trainer. Use a heat pack or a warm soak. SEEK MEDICAL CARE IF:   Symptoms get worse or do not improve in 2 weeks despite treatment.  New, unexplained symptoms develop (drugs used in treatment may produce side effects). EXERCISES RANGE OF MOTION (ROM) AND STRETCHING EXERCISES - Cervical Strain and Sprain These exercises may help you when beginning to rehabilitate your injury. In order to successfully resolve your symptoms, you must improve your posture. These exercises are designed to help reduce the forward-head and rounded-shoulder posture which contributes to this condition. Your symptoms may resolve with or without further involvement from your physician, physical therapist or athletic trainer. While completing these exercises, remember:   Restoring tissue flexibility helps normal motion to return to the joints. This allows healthier, less painful movement and activity.  An effective stretch should be held for at least 20 seconds, although you may need to begin with shorter hold times for comfort.  A stretch should never be painful. You should only feel a gentle lengthening or release in the stretched tissue. STRETCH- Axial Extensors  Lie on your back on the floor. You may bend your knees for  comfort. Place a rolled-up hand towel or dish towel, about 2 inches in diameter, under the part of your head that makes contact with the floor.  Gently tuck your chin, as if trying to make a "double chin," until you feel a gentle stretch at the base of your head.  Hold __________ seconds. Repeat __________ times. Complete this exercise __________ times per day.  STRETCH - Axial Extension   Stand or sit on a firm surface. Assume a good posture: chest up, shoulders drawn back, abdominal muscles slightly tense, knees unlocked (if standing) and feet hip width apart.  Slowly retract your chin so your head slides back and your chin slightly lowers. Continue to look straight ahead.  You should feel a gentle stretch in the back of your head. Be certain not to feel an aggressive stretch since this can cause headaches later.  Hold for __________ seconds. Repeat __________ times. Complete this exercise __________ times per day. STRETCH - Cervical Side Bend   Stand or sit on a firm surface. Assume a good posture: chest up, shoulders drawn back, abdominal muscles slightly tense, knees unlocked (if standing) and feet hip width apart.  Without letting your nose or shoulders move, slowly tip your right / left ear to your shoulder until your feel a gentle stretch in the muscles on the opposite side of your neck.  Hold __________  seconds. Repeat __________ times. Complete this exercise __________ times per day. STRETCH - Cervical Rotators   Stand or sit on a firm surface. Assume a good posture: chest up, shoulders drawn back, abdominal muscles slightly tense, knees unlocked (if standing) and feet hip width apart.  Keeping your eyes level with the ground, slowly turn your head until you feel a gentle stretch along the back and opposite side of your neck.  Hold __________ seconds. Repeat __________ times. Complete this exercise __________ times per day. RANGE OF MOTION - Neck Circles   Stand or sit on  a firm surface. Assume a good posture: chest up, shoulders drawn back, abdominal muscles slightly tense, knees unlocked (if standing) and feet hip width apart.  Gently roll your head down and around from the back of one shoulder to the back of the other. The motion should never be forced or painful.  Repeat the motion 10-20 times, or until you feel the neck muscles relax and loosen. Repeat __________ times. Complete the exercise __________ times per day. STRENGTHENING EXERCISES - Cervical Strain and Sprain These exercises may help you when beginning to rehabilitate your injury. They may resolve your symptoms with or without further involvement from your physician, physical therapist, or athletic trainer. While completing these exercises, remember:   Muscles can gain both the endurance and the strength needed for everyday activities through controlled exercises.  Complete these exercises as instructed by your physician, physical therapist, or athletic trainer. Progress the resistance and repetitions only as guided.  You may experience muscle soreness or fatigue, but the pain or discomfort you are trying to eliminate should never worsen during these exercises. If this pain does worsen, stop and make certain you are following the directions exactly. If the pain is still present after adjustments, discontinue the exercise until you can discuss the trouble with your clinician. STRENGTH - Cervical Flexors, Isometric  Face a wall, standing about 6 inches away. Place a small pillow, a ball about 6-8 inches in diameter, or a folded towel between your forehead and the wall.  Slightly tuck your chin and gently push your forehead into the soft object. Push only with mild to moderate intensity, building up tension gradually. Keep your jaw and forehead relaxed.  Hold 10 to 20 seconds. Keep your breathing relaxed.  Release the tension slowly. Relax your neck muscles completely before you start the next  repetition. Repeat __________ times. Complete this exercise __________ times per day. STRENGTH- Cervical Lateral Flexors, Isometric   Stand about 6 inches away from a wall. Place a small pillow, a ball about 6-8 inches in diameter, or a folded towel between the side of your head and the wall.  Slightly tuck your chin and gently tilt your head into the soft object. Push only with mild to moderate intensity, building up tension gradually. Keep your jaw and forehead relaxed.  Hold 10 to 20 seconds. Keep your breathing relaxed.  Release the tension slowly. Relax your neck muscles completely before you start the next repetition. Repeat __________ times. Complete this exercise __________ times per day. STRENGTH - Cervical Extensors, Isometric   Stand about 6 inches away from a wall. Place a small pillow, a ball about 6-8 inches in diameter, or a folded towel between the back of your head and the wall.  Slightly tuck your chin and gently tilt your head back into the soft object. Push only with mild to moderate intensity, building up tension gradually. Keep your jaw and forehead relaxed.  Hold 10 to 20 seconds. Keep your breathing relaxed.  Release the tension slowly. Relax your neck muscles completely before you start the next repetition. Repeat __________ times. Complete this exercise __________ times per day. POSTURE AND BODY MECHANICS CONSIDERATIONS - Cervical Strain and Sprain Keeping correct posture when sitting, standing or completing your activities will reduce the stress put on different body tissues, allowing injured tissues a chance to heal and limiting painful experiences. The following are general guidelines for improved posture. Your physician or physical therapist will provide you with any instructions specific to your needs. While reading these guidelines, remember:  The exercises prescribed by your provider will help you have the flexibility and strength to maintain correct  postures.  The correct posture provides the optimal environment for your joints to work. All of your joints have less wear and tear when properly supported by a spine with good posture. This means you will experience a healthier, less painful body.  Correct posture must be practiced with all of your activities, especially prolonged sitting and standing. Correct posture is as important when doing repetitive low-stress activities (typing) as it is when doing a single heavy-load activity (lifting). PROLONGED STANDING WHILE SLIGHTLY LEANING FORWARD When completing a task that requires you to lean forward while standing in one place for a long time, place either foot up on a stationary 2- to 4-inch high object to help maintain the best posture. When both feet are on the ground, the low back tends to lose its slight inward curve. If this curve flattens (or becomes too large), then the back and your other joints will experience too much stress, fatigue more quickly, and can cause pain.  RESTING POSITIONS Consider which positions are most painful for you when choosing a resting position. If you have pain with flexion-based activities (sitting, bending, stooping, squatting), choose a position that allows you to rest in a less flexed posture. You would want to avoid curling into a fetal position on your side. If your pain worsens with extension-based activities (prolonged standing, working overhead), avoid resting in an extended position such as sleeping on your stomach. Most people will find more comfort when they rest with their spine in a more neutral position, neither too rounded nor too arched. Lying on a non-sagging bed on your side with a pillow between your knees, or on your back with a pillow under your knees will often provide some relief. Keep in mind, being in any one position for a prolonged period of time, no matter how correct your posture, can still lead to stiffness. WALKING Walk with an upright  posture. Your ears, shoulders, and hips should all line up. OFFICE WORK When working at a desk, create an environment that supports good, upright posture. Without extra support, muscles fatigue and lead to excessive strain on joints and other tissues. CHAIR:  A chair should be able to slide under your desk when your back makes contact with the back of the chair. This allows you to work closely.  The chair's height should allow your eyes to be level with the upper part of your monitor and your hands to be slightly lower than your elbows.  Body position:  Your feet should make contact with the floor. If this is not possible, use a foot rest.  Keep your ears over your shoulders. This will reduce stress on your neck and low back. Document Released: 10/27/2005 Document Revised: 03/13/2014 Document Reviewed: 02/08/2009 [facility] Patient Information 2015 [place], [place]. This information is  not intended to replace advice given to you by your health care provider. Make sure you discuss any questions you have with your health care provider.   Emergency Department Resource Guide 1) Find a Doctor and Pay Out of Pocket Although you won't have to find out who is covered by your insurance plan, it is a good idea to ask around and get recommendations. You will then need to call the office and see if the doctor you have chosen will accept you as a new patient and what types of options they offer for patients who are self-pay. Some doctors offer discounts or will set up payment plans for their patients who do not have insurance, but you will need to ask so you aren't surprised when you get to your appointment.  2) Contact Your Local Health Department Not all health departments have doctors that can see patients for sick visits, but many do, so it is worth a call to see if yours does. If you don't know where your local health department is, you can check in your phone book. The CDC also has a tool to help you  locate your state's health department, and many state websites also have listings of all of their local health departments.  3) Find a [facility] If your illness is not likely to be very severe or complicated, you may want to try a walk in clinic. These are popping up all over the country in pharmacies, drugstores, and shopping centers. They're usually staffed by nurse practitioners or physician assistants that have been trained to treat common illnesses and complaints. They're usually fairly quick and inexpensive. However, if you have serious medical issues or chronic medical problems, these are probably not your best option.  No Primary Care Doctor: - Call Health Connect at  [phone] - they can help you locate a primary care doctor that  accepts your insurance, provides certain services, etc. - Physician Referral Service- [phone]  Chronic Pain Problems: Organization         Address  Phone   Notes  [facility]  [phone] Patients need to be referred by their primary care doctor.   Medication Assistance: Organization         Address  Phone   Notes  [facility] Medication [facility] [address] [place], [place] [address] [phone] --Must be a resident of [facility] -- Must have NO insurance coverage whatsoever (no Medicaid/ Medicare, etc.) -- The pt. MUST have a primary care doctor that directs their care regularly and follows them in the community   [facility]  [phone]   [employer]  [phone]    Agencies that provide inexpensive medical care: Organization         Address  Phone   Notes  [place]  [phone]   [facility]    [phone]   [facility] [place], [place] [address] [phone]   [facility] [address] [place] [phone]   [facility]    [phone]   [facility]    [phone]   [address],  Phone:  [phone], Fax:  [phone] Hours of Operation:  9 am - 6 pm, M-F.  Also accepts Medicaid/Medicare and self-pay.  [facility] for [place] [address], [address], [place] Phone: [phone], Fax: [phone]. Hours of Operation:  8:30 am - 5:30 pm, M-F.  Also accepts Medicaid and self-pay.  [facility] [address] [place] Phone: [phone]   [place], [place], [place] [phone], Ext. [phone] Mondays & Thursdays: 7-9 AM.  First 15 patients are seen on a first come, first serve basis.    [place] Providers:  Organization         Address  Phone   Notes  [facility] [address], [place] [phone] Also accepts self-pay patients.  [facility] [address], [place]  [phone]   [place], [address], [place] [phone]   [facility] [address], [place] [phone]   Lucianne Lei [address], [place]   [phone] Only accepts [place] patients after they have their name applied to their card.   Self-Pay (no insurance) in [facility]:  Organization         Address  Phone   Notes  Sickle Cell Patients, [facility] [place] [phone]   [facility] [place] [phone]   [facility] [place]  [place], [place], [place] [phone]   [facility]/Dr. Osei-Bonsu  [address], [place] or [address], [place] [phone] Phone number for both [place] and [place] locations is the same.  [facility] [place] [address],  [place] [phone]   [facility] [address], [place] or [address] [phone]   [facility] [address], [place] [phone], phone; [phone], fax Sees patients 1st and 3rd Saturday of every month.  Must not qualify for public or private insurance (i.e. Medicaid, Medicare, [place] Health Choice, Veterans' Benefits)  Household income should be no more than 200% of the poverty level The clinic cannot treat you if you are pregnant or think you are pregnant  Sexually transmitted diseases are not treated at the clinic.    Dental Care: Organization         Address  Phone  Notes  [facility] Department of [facility] [phone] Accepts children up to age 69 who are enrolled in [place] or [place]; pregnant women with a Medicaid card; and children who have applied for Medicaid or [place] Health Choice, but were declined, whose parents can pay a reduced fee at time of service.  [facility] Department of [facility]  [address], [place] [phone] Accepts children up to age 37 who are enrolled in [place] or [place]; pregnant women with a Medicaid card; and children who have applied for Medicaid or [place] Health Choice, but were declined, whose parents can pay a reduced fee at time of service.  [facility]  [place] [phone] Patients are seen by appointment only. Walk-ins are not accepted. [place] will see patients 41 years of age and older. Monday - Tuesday (8am-5pm) Most Wednesdays (8:30-5pm) $30 per visit, cash only  [place]  [place]  [address], [facility] [phone] Patients are seen by appointment only. Walk-ins are not accepted. [place] will see patients 83 years of age and older. One Wednesday Evening  (Monthly: Volunteer Based).  $30 per visit, cash only  [place]  [phone] for adults; Children under age 60, call Graduate Pediatric Dentistry at [phone]. Children aged 24-14, please call [phone] to request a pediatric application.  Dental services are provided in all areas of dental care including fillings, crowns and bridges, complete and partial dentures, implants, gum treatment, root canals, and extractions. Preventive care is also provided. Treatment is provided to both adults and children. Patients are selected via a lottery and there is often a waiting list.   [facility] [address], [place]  [phone] [email or web address]   [facility] [address] [place], [place] [phone], Ext. [phone] Second and Fourth Thursday of each month, opens at 6:30 AM; Clinic ends at 9 AM.  Patients are seen on a first-come first-served basis, and a limited number are seen during each clinic.   [facility]  [address] Hillard Danker [place], [place] [phone]   Eligibility Requirements You must have lived in [place], [place], or [place] counties for at least the last three months.   You cannot be eligible for state or federal sponsored [employer], including [employer], [place], or [employer].   You generally cannot be eligible for healthcare insurance through your employer.    How to apply: Eligibility screenings are held every Tuesday and Wednesday afternoon from 1:00 pm until 4:00 pm. You do not need an appointment for the interview!  [facility] [address] [place], [place]   [place]  [place] Department  [place]  [phone]    Behavioral Health Resources in the Community: Intensive Outpatient Programs Organization         Address  Phone  Notes  [place]. [address], [place], [place] [phone]   [facility] Outpatient [address] [place], [place]   ADS: [facility] Drug S[facility] [address], [place], [place]   [place] [address],  [place], [place] or [phone]   Substance Abuse Resources Organization         Address  Phone  Notes  Al[facility] Drug Services  [phone]   [place]  [phone]   [facility] [place]   Chinita Pester  [phone]   Residential & Outpatient Substance Abuse Program  [phone]   Psychological Services Organization         Address  Phone  Notes  [facility] [place]  [place]  [phone]   [place] [address] [place] or [phone]    Mobile Crisis Teams Organization         Address  Phone  Notes  [facility], Mobile Crisis Care Unit  [phone]   [facility]  [address] [place], [place]   [place] [address], [place] [phone]    Self-Help/Support Groups Organization         Address  Phone             Notes  [facility] [place] - variety  of support groups  [phone] Call for more information  Narcotics Anonymous (NA), [facility] [address], [employer] [place]  2 meetings at this location   Residential Facilities manager         Address  Phone  Notes  [facility] [place],    [place]  [phone]   [facility]  [address] [place] [address], [place], [place]   [place], [place] [phone] Admissions: 8am-3pm M-F  [facility]stance [place] [address],    [place], [place] [phone]   [facility] [address] [place],  [place], [place]   [facility] [address]  [place], [place]   [facility] - Intensive Outpatient [address] Kristeen Mans [address], [place], [place]   [facility] (Christine.) [address].,  [place], [place] [phone] or [phone]   [facility] ([facility]) [address] [place], [place] Accepts Medicaid  [facility] [place] [address]  [place] [phone] Substance Abuse/Addiction Treatment   [facility] Organization         Address  Phone  Notes  [facility]  [phone]   Domenic Schwab, PhD [address] Arlis Porta [place], [place]   [phone] or [phone]   [place] Kieler [place], [place] [phone]   [facility] [address], [place], [place] [phone] Insurance/Medicaid/sponsorship through [facility] and Families [address] [place]                                    [place], [place] [phone] [place] [address], [place] [phone]    Dr. Adele Schilder  [phone]   [facility] Dept. 1) [address] [place] 2) [place] 3)  [place] [address], [place] [phone]  [phone]   [place] [phone] or [phone] (After Hours)

## 2014-09-12 ENCOUNTER — Other Ambulatory Visit: Payer: Self-pay | Admitting: Endocrinology

## 2014-09-12 ENCOUNTER — Other Ambulatory Visit: Payer: Self-pay

## 2014-09-12 LAB — COMPREHENSIVE METABOLIC PANEL
ALT: 12 U/L (ref 0–35)
AST: 16 U/L (ref 0–37)
Albumin: 4 g/dL (ref 3.5–5.2)
Alkaline Phosphatase: 45 U/L (ref 39–117)
BUN: 11 mg/dL (ref 6–23)
CO2: 25 mEq/L (ref 19–32)
Calcium: 8.7 mg/dL (ref 8.4–10.5)
Chloride: 104 mEq/L (ref 96–112)
Creat: 0.75 mg/dL (ref 0.50–1.10)
Glucose, Bld: 93 mg/dL (ref 70–99)
Potassium: 4.4 mEq/L (ref 3.5–5.3)
Sodium: 138 mEq/L (ref 135–145)
Total Bilirubin: 0.2 mg/dL (ref 0.2–1.2)
Total Protein: 6.9 g/dL (ref 6.0–8.3)

## 2014-09-12 LAB — HEMOGLOBIN A1C
Hgb A1c MFr Bld: 6.4 % — ABNORMAL HIGH (ref <5.7)
Mean Plasma Glucose: 137 mg/dL — ABNORMAL HIGH (ref <117)

## 2014-09-12 LAB — MICROALBUMIN, URINE: Microalb, Ur: 4.1 mg/dL — ABNORMAL HIGH (ref <2.0)

## 2014-09-13 ENCOUNTER — Other Ambulatory Visit: Payer: Self-pay | Admitting: Sports Medicine

## 2014-09-13 DIAGNOSIS — M25511 Pain in right shoulder: Secondary | ICD-10-CM

## 2014-09-15 ENCOUNTER — Encounter: Payer: Self-pay | Admitting: Endocrinology

## 2014-09-15 ENCOUNTER — Ambulatory Visit (INDEPENDENT_AMBULATORY_CARE_PROVIDER_SITE_OTHER): Payer: Managed Care, Other (non HMO) | Admitting: Endocrinology

## 2014-09-15 VITALS — BP 124/77 | HR 67 | Temp 98.3°F | Resp 14 | Ht 66.0 in | Wt 228.8 lb

## 2014-09-15 DIAGNOSIS — E119 Type 2 diabetes mellitus without complications: Secondary | ICD-10-CM

## 2014-09-15 DIAGNOSIS — E669 Obesity, unspecified: Secondary | ICD-10-CM

## 2014-09-15 MED ORDER — PHENTERMINE-TOPIRAMATE ER 3.75-23 MG PO CP24: ORAL_CAPSULE | ORAL | Status: DC

## 2014-09-15 MED ORDER — PHENTERMINE-TOPIRAMATE ER 7.5-46 MG PO CP24: ORAL_CAPSULE | ORAL | Status: DC

## 2014-09-15 NOTE — Progress Notes (Signed)
Patient ID: Alicia Murray, female   DOB: 1961-07-22, 53 y.o.   MRN: [id number]   Reason for Appointment: Diabetes follow-up   History of Present Illness   Diagnosis: Type 2 DIABETES MELITUS, date of diagnosis:  2007     Previous history: She had significant hyperglycemia diagnosis and was treated with insulin and metformin. However with efforts to lose weight she was able to be treated without medications for sometime. She currently was started on metformin but because of her progressive weight gain she was switched to Victoza with good success and has been on this since 2/14. Currently detailed records are not available  Recent history:  Her blood sugars are fairly well controlled despite her weight gain She has been checking her blood sugars only very sporadically in the evenings Her fasting glucose was normal in the lab Overall compliance with diet and exercise is not any better and she has gained another 14 pounds She likes to eat sweets and this has not changed despite continuing Victoza She was previously exercising right after getting off her work late at night but is now is having various musculoskeletal issues Also has not been motivated to improve her diet     Side effects from medications: None       Monitors blood glucose:  minimal recently   Glucometer: One Touch.          Blood Glucose readings :105-133, has 3 readings in the evenings Hypoglycemia frequency:  none         Meals: 2 meal per day.          Physical activity: exercise: none               Complications: are: None  Eye exam 1/14    Wt Readings from Last 3 Encounters:  09/15/14 228 lb 12.8 oz (103.783 kg)  04/17/14 214 lb 9.6 oz (97.342 kg)  12/19/13 208 lb 6.4 oz (94.53 kg)    LABS:  Lab Results  Component Value Date   HGBA1C 6.4* 09/12/2014   HGBA1C 6.3 04/13/2014   HGBA1C 6.0 12/14/2013   Lab Results  Component Value Date   MICROALBUR 4.1* 09/12/2014   LDLCALC 78 04/13/2014   CREATININE 0.75 09/12/2014    Orders Only on 09/12/2014  Component Date Value Ref Range Status  . Sodium 09/12/2014 138  135 - 145 mEq/L Final  . Potassium 09/12/2014 4.4  3.5 - 5.3 mEq/L Final  . Chloride 09/12/2014 104  96 - 112 mEq/L Final  . CO2 09/12/2014 25  19 - 32 mEq/L Final  . Glucose, Bld 09/12/2014 93  70 - 99 mg/dL Final  . BUN 09/12/2014 11  6 - 23 mg/dL Final  . Creat 09/12/2014 0.75  0.50 - 1.10 mg/dL Final  . Total Bilirubin 09/12/2014 0.2  0.2 - 1.2 mg/dL Final  . Alkaline Phosphatase 09/12/2014 45  39 - 117 U/L Final  . AST 09/12/2014 16  0 - 37 U/L Final  . ALT 09/12/2014 12  0 - 35 U/L Final  . Total Protein 09/12/2014 6.9  6.0 - 8.3 g/dL Final  . Albumin 09/12/2014 4.0  3.5 - 5.2 g/dL Final  . Calcium 09/12/2014 8.7  8.4 - 10.5 mg/dL Final  . Hgb A1c MFr Bld 09/12/2014 6.4* <5.7 % Final   Comment:  According to the ADA Clinical Practice Recommendations for 2011, when HbA1c is used as a screening test:     >=6.5%   Diagnostic of Diabetes Mellitus            (if abnormal result is confirmed)   5.7-6.4%   Increased risk of developing Diabetes Mellitus   References:Diagnosis and Classification of Diabetes Mellitus,Diabetes HFSF,4239,53(UYEBX 1):S62-S69 and Standards of Medical Care in         Diabetes - 2011,Diabetes IDHW,8616,83 (Suppl 1):S11-S61.     . Mean Plasma Glucose 09/12/2014 137* <117 mg/dL Final  . Microalb, Ur 09/12/2014 4.1* <2.0 mg/dL Final   Comment: ** Please note change in reference range(s). ** The ADA (Diabetes Care 7290;21(JDBZM 1):S14-S80) has defined abnormalities in albumin excretion as follows:            Category           Result                            (mg/g creatinine)                 Normal:    <30       Microalbuminuria:    30 - 299   Clinical albuminuria:    > or = 300    The ADA recommends that at least two of three specimens collected within a 3 - 6  month period be abnormal before considering a patient to be within a diagnostic category.   Admission on 09/08/2014, Discharged on 09/09/2014  Component Date Value Ref Range Status  . WBC 09/08/2014 8.1  4.0 - 10.5 K/uL Final  . RBC 09/08/2014 4.37  3.87 - 5.11 MIL/uL Final  . Hemoglobin 09/08/2014 12.5  12.0 - 15.0 g/dL Final  . HCT 09/08/2014 37.8  36.0 - 46.0 % Final  . MCV 09/08/2014 86.5  78.0 - 100.0 fL Final  . MCH 09/08/2014 28.6  26.0 - 34.0 pg Final  . MCHC 09/08/2014 33.1  30.0 - 36.0 g/dL Final  . RDW 09/08/2014 13.2  11.5 - 15.5 % Final  . Platelets 09/08/2014 286  150 - 400 K/uL Final  . Sodium 09/08/2014 136* 137 - 147 mEq/L Final  . Potassium 09/08/2014 3.9  3.7 - 5.3 mEq/L Final  . Chloride 09/08/2014 99  96 - 112 mEq/L Final  . CO2 09/08/2014 25  19 - 32 mEq/L Final  . Glucose, Bld 09/08/2014 80  70 - 99 mg/dL Final  . BUN 09/08/2014 11  6 - 23 mg/dL Final  . Creatinine, Ser 09/08/2014 0.67  0.50 - 1.10 mg/dL Final  . Calcium 09/08/2014 8.8  8.4 - 10.5 mg/dL Final  . GFR calc non Af Amer 09/08/2014 >90  >90 mL/min Final  . GFR calc Af Amer 09/08/2014 >90  >90 mL/min Final   Comment: (NOTE)                          The eGFR has been calculated using the CKD EPI equation.                          This calculation has not been validated in all clinical situations.                          eGFR's persistently <90 mL/min signify possible Chronic Kidney  Disease.  . Anion gap 09/08/2014 12  5 - 15 Final      Medication List       This list is accurate as of: 09/15/14  4:20 PM.  Always use your most recent med list.               aspirin 325 MG tablet  Take 325 mg by mouth daily.     atorvastatin 10 MG tablet  Commonly known as:  LIPITOR  Take 10 mg by mouth daily.     B-COMPLEX-C PO  Take 1 tablet by mouth daily.     fluticasone 50 MCG/ACT nasal spray  Commonly known as:  FLONASE  Place 1 spray into both nostrils daily.      Liraglutide 18 MG/3ML Sopn  Commonly known as:  VICTOZA  Inject 1.2 mg into the skin daily.     niacin 500 MG tablet  Commonly known as:  SLO-NIACIN  Take 500 mg by mouth 2 (two) times daily with a meal.     nystatin-triamcinolone cream  Commonly known as:  MYCOLOG II     oxyCODONE-acetaminophen 5-325 MG per tablet  Commonly known as:  PERCOCET/ROXICET  Take 2 tablets by mouth every 4 (four) hours as needed for severe pain.     terbinafine 250 MG tablet  Commonly known as:  LAMISIL  Take 250 mg by mouth daily.     Vitamin D3 2000 UNITS Tabs  Take 1 tablet by mouth daily.     vitamin E 400 UNIT capsule  Generic drug:  vitamin E  Take 400 Units by mouth daily.        Allergies: No Known Allergies  Past Medical History  Diagnosis Date  . Thrombosed external hemorrhoid 08/15/85  . H/O varicella   . History of measles, mumps, or rubella   . Diabetes mellitus   . Bladder infection   . Fibroid   . Monilia infection 07/10/08  . Vagina itching 07/10/08  . Vulvitis 08/01/08  . Infertility   . Thyroid enlargement     Past Surgical History  Procedure Laterality Date  . Myomectomy abdominal approach  1977    Family History  Problem Relation Age of Onset  . Cancer Mother     Breast  . Cancer Father     Brain  . Hypertension Brother     Social History:  reports that she has quit smoking. She has never used smokeless tobacco. She reports that she does not drink alcohol or use illicit drugs.  Review of Systems:  Hypertension:  not present  Lipids: Has had diabetic dyslipidemia with increased particle number and triglycerides, has been on combination with statins and OTC niacin 500 mg twice a day which she is taking regularly now. No side effects from this. Her last LDL particle number  is below 1000  Lab Results  Component Value Date   CHOL 145 04/13/2014   HDL 51.90 04/13/2014   LDLCALC 78 04/13/2014   TRIG 77.0 04/13/2014   CHOLHDL 3 04/13/2014     She has  had carpal tunnel syndrome on the left side which had been treated conservatively and is using a splint  She has a history of goiter  Lab Results  Component Value Date   FREET4 0.91 08/11/2013   TSH 1.02 04/17/2014   TSH 1.48 08/11/2013    No numbness or tingling in her feet   Examination:   BP 124/77 mmHg  Pulse 67  Temp(Src) 98.3 F (36.8  C)  Resp 14  Ht '5\' 6"'  (1.676 m)  Wt 228 lb 12.8 oz (103.783 kg)  BMI 36.95 kg/m2  SpO2 95%  Body mass index is 36.95 kg/(m^2).   Not indicated  ASSESSMENT/ PLAN:   Diabetes type 2   Blood glucose control has been excellent with upper normal A1c Her main difficulties are poor compliance with controlling her portions and types of foods she is eating and has not exercised as before She has difficulty getting herself motivated  She may have had mild benefit from taking phentermine for weight loss Discussed importance of weight loss to prevent further progression of diabetes  OBESITY: she is willing to try Qsymia and discussed how this works as well as dosage titration She will call if she is not improved in the next 2-3 months with significant weight loss May be able to taper off her Victoza if she loses weight and is compliant with diet   Vaishali Baise 09/15/2014, 4:20 PM

## 2014-09-18 ENCOUNTER — Ambulatory Visit
Admission: RE | Admit: 2014-09-18 | Discharge: 2014-09-18 | Disposition: A | Discharge status: Home / Self Care | Payer: Managed Care, Other (non HMO) | Source: Ambulatory Visit | Attending: Sports Medicine | Admitting: Sports Medicine

## 2014-09-18 DIAGNOSIS — M25511 Pain in right shoulder: Secondary | ICD-10-CM

## 2014-12-13 ENCOUNTER — Other Ambulatory Visit (INDEPENDENT_AMBULATORY_CARE_PROVIDER_SITE_OTHER): Payer: Managed Care, Other (non HMO)

## 2014-12-13 DIAGNOSIS — E119 Type 2 diabetes mellitus without complications: Secondary | ICD-10-CM

## 2014-12-13 LAB — BASIC METABOLIC PANEL
BUN: 13 mg/dL (ref 6–23)
CO2: 31 mEq/L (ref 19–32)
Calcium: 9.2 mg/dL (ref 8.4–10.5)
Chloride: 105 mEq/L (ref 96–112)
Creatinine, Ser: 0.85 mg/dL (ref 0.40–1.20)
GFR: 90.18 mL/min (ref >60.00)
Glucose, Bld: 100 mg/dL — ABNORMAL HIGH (ref 70–99)
Potassium: 4.2 mEq/L (ref 3.5–5.1)
Sodium: 139 mEq/L (ref 135–145)

## 2014-12-13 LAB — LIPID PANEL
Cholesterol: 144 mg/dL (ref 0–200)
HDL: 42.9 mg/dL (ref >39.00)
LDL Cholesterol: 77 mg/dL (ref 0–99)
NonHDL: 101.1
Total CHOL/HDL Ratio: 3
Triglycerides: 121 mg/dL (ref 0.0–149.0)
VLDL: 24.2 mg/dL (ref 0.0–40.0)

## 2014-12-13 LAB — HEMOGLOBIN A1C: Hgb A1c MFr Bld: 6.5 % (ref 4.6–6.5)

## 2014-12-18 ENCOUNTER — Encounter: Payer: Self-pay | Admitting: Endocrinology

## 2014-12-18 ENCOUNTER — Ambulatory Visit (INDEPENDENT_AMBULATORY_CARE_PROVIDER_SITE_OTHER): Payer: Managed Care, Other (non HMO) | Admitting: Endocrinology

## 2014-12-18 VITALS — BP 130/80 | HR 70 | Temp 98.0°F | Resp 14 | Ht 66.0 in | Wt 228.6 lb

## 2014-12-18 DIAGNOSIS — E119 Type 2 diabetes mellitus without complications: Secondary | ICD-10-CM

## 2014-12-18 DIAGNOSIS — E785 Hyperlipidemia, unspecified: Secondary | ICD-10-CM

## 2014-12-18 NOTE — Patient Instructions (Addendum)
Check coverage of Saxenda or Trulicity  Take Slo-Niacin at lunch  Exercise!

## 2014-12-18 NOTE — Progress Notes (Signed)
Patient ID: Alicia Murray, female   DOB: 31-Jan-1961, 54 y.o.   MRN: [id number]   Reason for Appointment: Diabetes follow-up   History of Present Illness   Diagnosis: Type 2 DIABETES MELITUS, date of diagnosis:  2007     Previous history: She had significant hyperglycemia diagnosis and was treated with insulin and metformin. However with efforts to lose weight she was able to be treated without medications for sometime. She currently was started on metformin but because of her progressive weight gain she was switched to Victoza with good success and has been on this since 2/14. Currently detailed records are not available  Recent history:  Her blood sugars are fairly well controlled despite her poor compliance with diet and lack of exercise. She was given Qsymia to try on her last visit but she thinks it was difficult to swallow and did not feel any different with the starting 14 day trial She does not want to try this again at a higher dose. She likes to eat sweets and this has not tried to improve her compliance despite continuing Victoza She was previously exercising right after getting off her work late at night but does not have access to the gym late at night Also has not checked her blood sugar at all. However has not had any further weight gain.     Side effects from medications: None       Monitors blood glucose:  minimal recently   Glucometer: One Touch.          Blood Glucose readings : None    Meals: 2 meals per day.          Physical activity: exercise: none               Complications: are: None  Eye exam 1/14    Wt Readings from Last 3 Encounters:  12/18/14 228 lb 9.6 oz (103.692 kg)  09/18/14 230 lb (104.327 kg)  09/15/14 228 lb 12.8 oz (103.783 kg)    LABS:  Lab Results  Component Value Date   HGBA1C 6.5 12/13/2014   HGBA1C 6.4* 09/12/2014   HGBA1C 6.3 04/13/2014   Lab Results  Component Value Date   MICROALBUR 4.1* 09/12/2014   LDLCALC 77  12/13/2014   CREATININE 0.85 12/13/2014    Appointment on 12/13/2014  Component Date Value Ref Range Status  . Hgb A1c MFr Bld 12/13/2014 6.5  4.6 - 6.5 % Final   Glycemic Control Guidelines for People with Diabetes:Non Diabetic:  <6%Goal of Therapy: <7%Additional Action Suggested:  >8%   . Sodium 12/13/2014 139  135 - 145 mEq/L Final  . Potassium 12/13/2014 4.2  3.5 - 5.1 mEq/L Final  . Chloride 12/13/2014 105  96 - 112 mEq/L Final  . CO2 12/13/2014 31  19 - 32 mEq/L Final  . Glucose, Bld 12/13/2014 100* 70 - 99 mg/dL Final  . BUN 12/13/2014 13  6 - 23 mg/dL Final  . Creatinine, Ser 12/13/2014 0.85  0.40 - 1.20 mg/dL Final  . Calcium 12/13/2014 9.2  8.4 - 10.5 mg/dL Final  . GFR 12/13/2014 90.18  >60.00 mL/min Final  . Cholesterol 12/13/2014 144  0 - 200 mg/dL Final   ATP III Classification       Desirable:  < 200 mg/dL               Borderline High:  200 - 239 mg/dL          High:  > = 240 mg/dL  .  Triglycerides 12/13/2014 121.0  0.0 - 149.0 mg/dL Final   Normal:  <150 mg/dLBorderline High:  150 - 199 mg/dL  . HDL 12/13/2014 42.90  >39.00 mg/dL Final  . VLDL 12/13/2014 24.2  0.0 - 40.0 mg/dL Final  . LDL Cholesterol 12/13/2014 77  0 - 99 mg/dL Final  . Total CHOL/HDL Ratio 12/13/2014 3   Final                  Men          Women1/2 Average Risk     3.4          3.3Average Risk          5.0          4.42X Average Risk          9.6          7.13X Average Risk          15.0          11.0                      . NonHDL 12/13/2014 101.10   Final   NOTE:  Non-HDL goal should be 30 mg/dL higher than patient's LDL goal (i.e. LDL goal of < 70 mg/dL, would have non-HDL goal of < 100 mg/dL)      Medication List       This list is accurate as of: 12/18/14  1:09 PM.  Always use your most recent med list.               aspirin 325 MG tablet  Take 325 mg by mouth daily.     atorvastatin 10 MG tablet  Commonly known as:  LIPITOR  Take 10 mg by mouth daily.     B-COMPLEX-C PO  Take 1  tablet by mouth daily.     fluticasone 50 MCG/ACT nasal spray  Commonly known as:  FLONASE  Place 1 spray into both nostrils daily.     Liraglutide 18 MG/3ML Sopn  Commonly known as:  VICTOZA  Inject 1.2 mg into the skin daily.     niacin 500 MG tablet  Commonly known as:  SLO-NIACIN  Take 500 mg by mouth 2 (two) times daily with a meal.     nystatin-triamcinolone cream  Commonly known as:  MYCOLOG II     oxyCODONE-acetaminophen 5-325 MG per tablet  Commonly known as:  PERCOCET/ROXICET  Take 2 tablets by mouth every 4 (four) hours as needed for severe pain.     Phentermine-Topiramate 3.75-23 MG Cp24  1x daily     Phentermine-Topiramate 7.5-46 MG Cp24  Commonly known as:  QSYMIA  Once daily     terbinafine 250 MG tablet  Commonly known as:  LAMISIL  Take 250 mg by mouth daily.     Turmeric 500 MG Caps  Take by mouth.     Vitamin D3 2000 UNITS Tabs  Take 1 tablet by mouth daily.     vitamin E 400 UNIT capsule  Generic drug:  vitamin E  Take 400 Units by mouth daily.        Allergies: No Known Allergies  Past Medical History  Diagnosis Date  . Thrombosed external hemorrhoid 08/15/85  . H/O varicella   . History of measles, mumps, or rubella   . Diabetes mellitus   . Bladder infection   . Fibroid   . Monilia infection 07/10/08  . Vagina itching 07/10/08  .  Vulvitis 08/01/08  . Infertility   . Thyroid enlargement     Past Surgical History  Procedure Laterality Date  . Myomectomy abdominal approach  1977    Family History  Problem Relation Age of Onset  . Cancer Mother     Breast  . Cancer Father     Brain  . Hypertension Brother     Social History:  reports that she has quit smoking. She has never used smokeless tobacco. She reports that she does not drink alcohol or use illicit drugs.  Review of Systems:  Hypertension:  none, blood pressure initially high today  Lipids: Has had diabetic dyslipidemia with increased particle number and  triglycerides, has been on combination with statins and OTC niacin 500 mg once a day which she is not taking regularly now.  She tries to take aspirin before taking this but usually does this at her break at lunchtime.  She will forget this about twice a week Her last LDL particle number  is below 1000  Lab Results  Component Value Date   CHOL 144 12/13/2014   HDL 42.90 12/13/2014   LDLCALC 77 12/13/2014   TRIG 121.0 12/13/2014   CHOLHDL 3 12/13/2014      She has a history of goiter  Lab Results  Component Value Date   FREET4 0.91 08/11/2013   TSH 1.02 04/17/2014   TSH 1.48 08/11/2013      Examination:   BP 140/86 mmHg  Pulse 70  Temp(Src) 98 F (36.7 C)  Resp 14  Ht 5\' 6"  (1.676 m)  Wt 228 lb 9.6 oz (103.692 kg)  BMI 36.91 kg/m2  SpO2 96%  Body mass index is 36.91 kg/(m^2).   Diabetic foot exam shows normal monofilament sensation in the toes and plantar surfaces, no skin lesions or ulcers on the feet and normal pedal pulses  Repeat blood pressure 130/80  ASSESSMENT/ PLAN:   Diabetes type 2   Blood glucose control has been good with upper normal A1c Her main difficulties are poor compliance with controlling her portions and types of foods she is eating and has not made any attempts to start her exercise program despite reminders. She also has difficulty losing weight and has not been able to take Qsymia as above. She will try to start an exercise program at her new gym  HYPERLIPIDEMIA: Although her lipid panel looks excellent she has had high particle number before and will need to take her niacin consistently.  She can try to take this at lunch and may not need aspirin with a 500 mg dose  OBESITY: she is going to look into the coverage for Saxenda and call us back.   Cobain Morici 12/18/2014, 1:09 PM

## 2015-04-10 ENCOUNTER — Telehealth: Payer: Self-pay | Admitting: Endocrinology

## 2015-04-10 ENCOUNTER — Telehealth: Payer: Self-pay | Admitting: *Deleted

## 2015-04-10 ENCOUNTER — Other Ambulatory Visit: Payer: Self-pay | Admitting: *Deleted

## 2015-04-10 NOTE — Telephone Encounter (Signed)
We had wanted to use Saxenda.  Not clear why she is asking about Trulicity

## 2015-04-10 NOTE — Telephone Encounter (Signed)
Trulicity is no better than Victoza 1.8mg , can stay on this

## 2015-04-10 NOTE — Telephone Encounter (Signed)
Patient called and would like for her medication to be sent to her pharmacy   Rx: Trulicity   Pharmacy: [facility]   Thank you

## 2015-04-10 NOTE — Telephone Encounter (Signed)
Please see below and advise on dose. Thanks 

## 2015-04-10 NOTE — Telephone Encounter (Signed)
Patient said Trulicity was covered with a $5.00 copay, saxenda was not

## 2015-04-12 ENCOUNTER — Other Ambulatory Visit: Payer: Self-pay | Admitting: *Deleted

## 2015-04-12 MED ORDER — LIRAGLUTIDE 18 MG/3ML ~~LOC~~ SOPN: 1.8000 mg | PEN_INJECTOR | Freq: Every day | SUBCUTANEOUS | Status: DC

## 2015-04-12 NOTE — Telephone Encounter (Signed)
Patient has some information from her insurance to tell you.

## 2015-04-14 ENCOUNTER — Other Ambulatory Visit: Payer: Self-pay | Admitting: Endocrinology

## 2015-04-23 NOTE — Telephone Encounter (Signed)
Need to speak with Nurse

## 2015-04-27 ENCOUNTER — Other Ambulatory Visit: Payer: Self-pay | Admitting: *Deleted

## 2015-04-27 ENCOUNTER — Other Ambulatory Visit (INDEPENDENT_AMBULATORY_CARE_PROVIDER_SITE_OTHER): Payer: Managed Care, Other (non HMO)

## 2015-04-27 DIAGNOSIS — E119 Type 2 diabetes mellitus without complications: Secondary | ICD-10-CM

## 2015-04-27 DIAGNOSIS — E785 Hyperlipidemia, unspecified: Secondary | ICD-10-CM

## 2015-04-27 LAB — URINALYSIS, ROUTINE W REFLEX MICROSCOPIC
Bilirubin Urine: NEGATIVE
Hgb urine dipstick: NEGATIVE
Ketones, ur: NEGATIVE
Leukocytes, UA: NEGATIVE
Nitrite: NEGATIVE
Specific Gravity, Urine: >=1.03 — AB (ref 1.000–1.030)
Total Protein, Urine: NEGATIVE
Urine Glucose: NEGATIVE
Urobilinogen, UA: 0.2 (ref 0.0–1.0)
pH: 5.5 (ref 5.0–8.0)

## 2015-04-27 LAB — COMPREHENSIVE METABOLIC PANEL
ALT: 22 U/L (ref 0–35)
AST: 22 U/L (ref 0–37)
Albumin: 4.1 g/dL (ref 3.5–5.2)
Alkaline Phosphatase: 46 U/L (ref 39–117)
BUN: 12 mg/dL (ref 6–23)
CO2: 28 mEq/L (ref 19–32)
Calcium: 8.9 mg/dL (ref 8.4–10.5)
Chloride: 104 mEq/L (ref 96–112)
Creatinine, Ser: 0.75 mg/dL (ref 0.40–1.20)
GFR: 104.04 mL/min (ref >60.00)
Glucose, Bld: 101 mg/dL — ABNORMAL HIGH (ref 70–99)
Potassium: 3.9 mEq/L (ref 3.5–5.1)
Sodium: 136 mEq/L (ref 135–145)
Total Bilirubin: 0.4 mg/dL (ref 0.2–1.2)
Total Protein: 7.1 g/dL (ref 6.0–8.3)

## 2015-04-27 LAB — MICROALBUMIN / CREATININE URINE RATIO
Creatinine,U: 213.9 mg/dL
Microalb Creat Ratio: 2.6 mg/g (ref 0.0–30.0)
Microalb, Ur: 5.5 mg/dL — ABNORMAL HIGH (ref 0.0–1.9)

## 2015-04-27 LAB — HEMOGLOBIN A1C: Hgb A1c MFr Bld: 6.3 % (ref 4.6–6.5)

## 2015-04-27 MED ORDER — INSULIN PEN NEEDLE 32G X 4 MM MISC: Status: DC

## 2015-04-28 LAB — LIPOPROTEIN ANALYSIS BY NMR
HDL Particle Number: 37.4 umol/L (ref >=30.5)
LDL Particle Number: 1144 nmol/L — ABNORMAL HIGH (ref <1000)
LDL Size: 20.4 nm (ref >20.5)
LP-IR Score: 71 — ABNORMAL HIGH (ref <=45)
Small LDL Particle Number: 592 nmol/L — ABNORMAL HIGH (ref <=527)

## 2015-05-01 ENCOUNTER — Ambulatory Visit (INDEPENDENT_AMBULATORY_CARE_PROVIDER_SITE_OTHER): Payer: Managed Care, Other (non HMO) | Admitting: Endocrinology

## 2015-05-01 ENCOUNTER — Encounter: Payer: Self-pay | Admitting: Endocrinology

## 2015-05-01 ENCOUNTER — Ambulatory Visit: Payer: Self-pay | Admitting: Endocrinology

## 2015-05-01 VITALS — BP 118/62 | HR 83 | Temp 98.0°F | Resp 15 | Ht 67.0 in | Wt 232.0 lb

## 2015-05-01 DIAGNOSIS — E785 Hyperlipidemia, unspecified: Secondary | ICD-10-CM | POA: Diagnosis not present

## 2015-05-01 DIAGNOSIS — E119 Type 2 diabetes mellitus without complications: Secondary | ICD-10-CM | POA: Diagnosis not present

## 2015-05-01 DIAGNOSIS — E669 Obesity, unspecified: Secondary | ICD-10-CM

## 2015-05-01 MED ORDER — LIRAGLUTIDE -WEIGHT MANAGEMENT 18 MG/3ML ~~LOC~~ SOPN: 3.0000 mg | PEN_INJECTOR | Freq: Every day | SUBCUTANEOUS | Status: DC

## 2015-05-01 NOTE — Progress Notes (Signed)
Patient ID: Alicia Murray, female   DOB: 1961/01/05, 54 y.o.   MRN: [id number]   Reason for Appointment: Diabetes follow-up   History of Present Illness   Diagnosis: Type 2 DIABETES MELITUS, date of diagnosis:  2007     Previous history: She had significant hyperglycemia diagnosis and was treated with insulin and metformin. However with efforts to lose weight she was able to be treated without medications for sometime. She currently was started on metformin but because of her progressive weight gain she was switched to Victoza with good success and has been on this since 2/14. Currently detailed records are not available  Recent history:  Her blood sugars are still well controlled despite her poor compliance with diet and lack of exercise. She has gained a little weight since her last visit She likes to eat sweets and this has not tried to improve her compliance despite continuing Victoza 1.8 mg Even though she has a gym membership now she has not been motivated to go walking or exercising Also has not checked her blood sugar since her last visit     Side effects from medications: None       Monitors blood glucose:  none recently   Glucometer: One Touch.          Blood Glucose readings : None    Meals: 2 meals per day.          Physical activity: exercise: none               Complications: are: None     Wt Readings from Last 3 Encounters:  05/01/15 232 lb (105.235 kg)  12/18/14 228 lb 9.6 oz (103.692 kg)  09/18/14 230 lb (104.327 kg)    LABS:  Lab Results  Component Value Date   HGBA1C 6.3 04/27/2015   HGBA1C 6.5 12/13/2014   HGBA1C 6.4* 09/12/2014   Lab Results  Component Value Date   MICROALBUR 5.5* 04/27/2015   LDLCALC 77 12/13/2014   CREATININE 0.75 04/27/2015    Lab on 04/27/2015  Component Date Value Ref Range Status  . Hgb A1c MFr Bld 04/27/2015 6.3  4.6 - 6.5 % Final   Glycemic Control Guidelines for People with Diabetes:Non Diabetic:   <6%Goal of Therapy: <7%Additional Action Suggested:  >8%   . Sodium 04/27/2015 136  135 - 145 mEq/L Final  . Potassium 04/27/2015 3.9  3.5 - 5.1 mEq/L Final  . Chloride 04/27/2015 104  96 - 112 mEq/L Final  . CO2 04/27/2015 28  19 - 32 mEq/L Final  . Glucose, Bld 04/27/2015 101* 70 - 99 mg/dL Final  . BUN 04/27/2015 12  6 - 23 mg/dL Final  . Creatinine, Ser 04/27/2015 0.75  0.40 - 1.20 mg/dL Final  . Total Bilirubin 04/27/2015 0.4  0.2 - 1.2 mg/dL Final  . Alkaline Phosphatase 04/27/2015 46  39 - 117 U/L Final  . AST 04/27/2015 22  0 - 37 U/L Final  . ALT 04/27/2015 22  0 - 35 U/L Final  . Total Protein 04/27/2015 7.1  6.0 - 8.3 g/dL Final  . Albumin 04/27/2015 4.1  3.5 - 5.2 g/dL Final  . Calcium 04/27/2015 8.9  8.4 - 10.5 mg/dL Final  . GFR 04/27/2015 104.04  >60.00 mL/min Final  . Microalb, Ur 04/27/2015 5.5* 0.0 - 1.9 mg/dL Final  . Creatinine,U 04/27/2015 213.9   Final  . Microalb Creat Ratio 04/27/2015 2.6  0.0 - 30.0 mg/g Final  . Color, Urine 04/27/2015 YELLOW  Yellow;Lt. Yellow Final  . APPearance 04/27/2015 CLEAR  Clear Final  . Specific Gravity, Urine 04/27/2015 >=1.030* 1.000 - 1.030 Final  . pH 04/27/2015 5.5  5.0 - 8.0 Final  . Total Protein, Urine 04/27/2015 NEGATIVE  Negative Final  . Urine Glucose 04/27/2015 NEGATIVE  Negative Final  . Ketones, ur 04/27/2015 NEGATIVE  Negative Final  . Bilirubin Urine 04/27/2015 NEGATIVE  Negative Final  . Hgb urine dipstick 04/27/2015 NEGATIVE  Negative Final  . Urobilinogen, UA 04/27/2015 0.2  0.0 - 1.0 Final  . Leukocytes, UA 04/27/2015 NEGATIVE  Negative Final  . Nitrite 04/27/2015 NEGATIVE  Negative Final  . WBC, UA 04/27/2015 0-2/hpf  0-2/hpf Final  . RBC / HPF 04/27/2015 0-2/hpf  0-2/hpf Final  . Mucus, UA 04/27/2015 Presence of* None Final  . Squamous Epithelial / LPF 04/27/2015 Rare(0-4/hpf)  Rare(0-4/hpf) Final  . Renal Epithel, UA 04/27/2015 Rare(0-4/hpf)* None Final  . Bacteria, UA 04/27/2015 Rare(<10/hpf)* None Final    . Granular Casts, UA 04/27/2015 Presence of* None Final  . LDL Particle Number 04/27/2015 1144* <1000 nmol/L Final   Comment:                           Low                   < 1000                           Moderate         1000 - 1299                           Borderline-High  1300 - 1599                           High             1600 - 2000                           Very High             > 2000   . HDL Particle Number 04/27/2015 37.4  >=30.5 umol/L Final  . Small LDL Particle Number 04/27/2015 592* <=527 nmol/L Final  . LDL Size 04/27/2015 20.4  >20.5 nm Final   Comment:  ----------------------------------------------------------                  ** INTERPRETATIVE INFORMATION**                  PARTICLE CONCENTRATION AND SIZE                     <--Lower CVD Risk   Higher CVD Risk-->   LDL AND HDL PARTICLES   Percentile in Reference Population   HDL-P (total)        High     75th    50th    25th   Low                        >34.9    34.9    30.5    26.7   <26.7   Small LDL-P          Low      25th    50th  75th   High                        <117     117     527     839    >839   LDL Size   <-Large (Pattern A)->    <-Small (Pattern B)->                     23.0    20.6           20.5      19.0  ---------------------------------------------------------- Small LDL-P and LDL Size are associated with CVD risk, but not after LDL-P is taken into account. These assays were developed and their performance characteristics determined by [employer]. These assays have not been cleared by the [place] Food and Drug Administration. The clinical utility o                          f these laboratory values have not been fully established.   Marland Kitchen LP-IR Score 04/27/2015 71* <=45 Final   Comment: INSULIN RESISTANCE MARKER     <--Insulin Sensitive    Insulin Resistant-->            Percentile in Reference Population Insulin Resistance Score LP-IR Score   Low   25th   50th   75th   High                <27   27     45     63     >63 LP-IR Score is inaccurate if patient is non-fasting. The LP-IR score is a laboratory developed index that has been associated with insulin resistance and diabetes risk and should be used as one component of a physician's clinical assessment. The LP-IR score listed above has not been cleared by the [place] Food and Drug Administration.       Medication List       This list is accurate as of: 05/01/15 10:59 AM.  Always use your most recent med list.               aspirin 325 MG tablet  Take 325 mg by mouth daily.     atorvastatin 10 MG tablet  Commonly known as:  LIPITOR  TAKE 1 TABLET (10 MG TOTAL) BY MOUTH DAILY.     B-COMPLEX-C PO  Take 1 tablet by mouth daily.     fluticasone 50 MCG/ACT nasal spray  Commonly known as:  FLONASE  Place 1 spray into both nostrils daily.     Insulin Pen Needle 32G X 4 MM Misc  Commonly known as:  BD PEN NEEDLE NANO U/F  Use one per day with Victoza     Liraglutide 18 MG/3ML Sopn  Commonly known as:  VICTOZA  Inject 0.3 mLs (1.8 mg total) into the skin daily.     niacin 500 MG tablet  Commonly known as:  SLO-NIACIN  Take 500 mg by mouth 2 (two) times daily with a meal.     nystatin-triamcinolone cream  Commonly known as:  MYCOLOG II     Turmeric 500 MG Caps  Take by mouth.     Vitamin D3 2000 UNITS Tabs  Take 1 tablet by mouth daily.     vitamin E 400 UNIT capsule  Generic drug:  vitamin E  Take 400 Units by mouth daily.  Allergies: No Known Allergies  Past Medical History  Diagnosis Date  . Thrombosed external hemorrhoid 08/15/85  . H/O varicella   . History of measles, mumps, or rubella   . Diabetes mellitus   . Bladder infection   . Fibroid   . Monilia infection 07/10/08  . Vagina itching 07/10/08  . Vulvitis 08/01/08  . Infertility   . Thyroid enlargement     Past Surgical History  Procedure Laterality Date  . Myomectomy abdominal approach  1977    Family History  Problem  Relation Age of Onset  . Cancer Mother     Breast  . Cancer Father     Brain  . Hypertension Brother     Social History:  reports that she has quit smoking. She has never used smokeless tobacco. She reports that she does not drink alcohol or use illicit drugs.  Review of Systems:  Hypertension:  none, blood pressure initially high today  Lipids: Has had diabetic dyslipidemia with increased particle number and triglycerides, has been on combination with statins and OTC niacin 500 mg once a day which she is not taking regularly, usually 5 days a week She now says that she is taking are aspirin at the same time as her niacin; usually does this at her break at lunchtime.   LDL particle number has gone up to 1140 for now, previously below 1000   Lab Results  Component Value Date   CHOL 144 12/13/2014   HDL 42.90 12/13/2014   LDLCALC 77 12/13/2014   TRIG 121.0 12/13/2014   CHOLHDL 3 12/13/2014      She has a history of goiter  Lab Results  Component Value Date   FREET4 0.91 08/11/2013   TSH 1.02 04/17/2014   TSH 1.48 08/11/2013      Examination:   BP 118/62 mmHg  Pulse 83  Temp(Src) 98 F (36.7 C) (Oral)  Resp 15  Ht 5\' 7"  (1.702 m)  Wt 232 lb (105.235 kg)  BMI 36.33 kg/m2  SpO2 97%  Body mass index is 36.33 kg/(m^2).   Diabetic foot exam shows normal monofilament sensation in the toes and plantar surfaces, no skin lesions or ulcers on the feet and normal pedal pulses No edema  ASSESSMENT/ PLAN:   Diabetes type 2   Blood glucose control has been good with upper normal A1c This is despite poor compliance with controlling her portions and types of foods she is eating and still has not made any attempts to start her exercise program despite reminders and her having a gym membership. She also has difficulty losing weight even with taking Victoza; already has tried Qsymia without benefit  She will try to start an exercise regimen with at least a home exercise video     HYPERLIPIDEMIA: Although her lipid panel looks excellent she now has a much higher LDL particle number before and will need to take her niacin consistently.  She is not taking the aspirin 30 minutes before the niacin this is why she is having flushing and discussed how to do this as well as go ahead and increase the dose to 1000 mg If not tolerated she can try Niaspan  OBESITY: Will send in prescription for Saxenda, discussed how this works and given her patient information on this   [facility] 05/01/2015, 10:59 AM

## 2015-05-01 NOTE — Patient Instructions (Addendum)
Take aspirin 30 min before Niacin and then try taking 2 pills

## 2015-05-15 LAB — HM DIABETES EYE EXAM

## 2015-05-18 ENCOUNTER — Encounter: Payer: Self-pay | Admitting: *Deleted

## 2015-08-28 ENCOUNTER — Other Ambulatory Visit (INDEPENDENT_AMBULATORY_CARE_PROVIDER_SITE_OTHER): Payer: Managed Care, Other (non HMO)

## 2015-08-28 DIAGNOSIS — E119 Type 2 diabetes mellitus without complications: Secondary | ICD-10-CM | POA: Diagnosis not present

## 2015-08-28 LAB — COMPREHENSIVE METABOLIC PANEL
ALT: 26 U/L (ref 0–35)
AST: 27 U/L (ref 0–37)
Albumin: 4.2 g/dL (ref 3.5–5.2)
Alkaline Phosphatase: 44 U/L (ref 39–117)
BUN: 16 mg/dL (ref 6–23)
CO2: 31 mEq/L (ref 19–32)
Calcium: 9.3 mg/dL (ref 8.4–10.5)
Chloride: 104 mEq/L (ref 96–112)
Creatinine, Ser: 0.84 mg/dL (ref 0.40–1.20)
GFR: 91.17 mL/min (ref >60.00)
Glucose, Bld: 108 mg/dL — ABNORMAL HIGH (ref 70–99)
Potassium: 4 mEq/L (ref 3.5–5.1)
Sodium: 139 mEq/L (ref 135–145)
Total Bilirubin: 0.4 mg/dL (ref 0.2–1.2)
Total Protein: 7.5 g/dL (ref 6.0–8.3)

## 2015-08-28 LAB — HEMOGLOBIN A1C: Hgb A1c MFr Bld: 6.3 % (ref 4.6–6.5)

## 2015-08-31 ENCOUNTER — Encounter: Payer: Self-pay | Admitting: Endocrinology

## 2015-08-31 ENCOUNTER — Ambulatory Visit (INDEPENDENT_AMBULATORY_CARE_PROVIDER_SITE_OTHER): Payer: Managed Care, Other (non HMO) | Admitting: Endocrinology

## 2015-08-31 VITALS — BP 114/70 | HR 75 | Temp 98.3°F | Resp 14 | Ht 67.0 in | Wt 230.8 lb

## 2015-08-31 DIAGNOSIS — E119 Type 2 diabetes mellitus without complications: Secondary | ICD-10-CM | POA: Diagnosis not present

## 2015-08-31 DIAGNOSIS — E785 Hyperlipidemia, unspecified: Secondary | ICD-10-CM

## 2015-08-31 DIAGNOSIS — E669 Obesity, unspecified: Secondary | ICD-10-CM | POA: Diagnosis not present

## 2015-08-31 MED ORDER — FLUTICASONE PROPIONATE 50 MCG/ACT NA SUSP: 1.0000 | Freq: Every day | NASAL | Status: DC

## 2015-08-31 NOTE — Patient Instructions (Signed)
Check blood sugars on waking up 1-2  times a week Also check blood sugars about 2 hours after a meal and do this after different meals by rotation  Recommended blood sugar levels on waking up is 90-130 and about 2 hours after meal is 130-160  Please bring your blood sugar monitor to each visit, thank you  Restart walking and go after work

## 2015-08-31 NOTE — Progress Notes (Addendum)
Patient ID: Alicia Murray, female   DOB: 1960/12/16, 54 y.o.   MRN: [id number]   Reason for Appointment: Diabetes follow-up   History of Present Illness   Diagnosis: Type 2 DIABETES MELITUS, date of diagnosis:  2007     Previous history: She had significant hyperglycemia diagnosis and was treated with insulin and metformin. However with efforts to lose weight she was able to be treated without medications for sometime. She currently was started on metformin but because of her progressive weight gain she was switched to Victoza with good success and has been on this since 2/14. Currently detailed records are not available  Recent history:  Her blood sugars are still well controlled despite her poor compliance with diet and inadequate level of exercise. A1c is upper normal at 6.3 again She did try to start exercising but has been doing this intermittently and none recently She has lost a little weight since her last visit  She likes to eat sweets and has difficulty with compliance despite continuing Victoza 1.8 mg.  Her insurance does not cover  Bellerose Also has not checked her blood sugar since her last visit     Side effects from medications: None       Monitors blood glucose:  none recently   Glucometer: One Touch.          Blood Glucose readings : None    Meals: 2 meals per day.          Physical activity: exercise: none recently                Complications: are: None     Wt Readings from Last 3 Encounters:  03/18/16 222 lb 3.2 oz (100.789 kg)  08/31/15 230 lb 12.8 oz (104.69 kg)  05/01/15 232 lb (105.235 kg)    LABS:  Lab Results  Component Value Date   HGBA1C 6.5 02/25/2016   HGBA1C 6.3 08/28/2015   HGBA1C 6.3 04/27/2015   Lab Results  Component Value Date   MICROALBUR 5.5* 04/27/2015   LDLCALC 77 12/13/2014   CREATININE 0.78 02/25/2016    Appointment on 08/28/2015  Component Date Value Ref Range Status  . Hgb A1c MFr Bld 08/28/2015 6.3  4.6  - 6.5 % Final   Glycemic Control Guidelines for People with Diabetes:Non Diabetic:  <6%Goal of Therapy: <7%Additional Action Suggested:  >8%   . Sodium 08/28/2015 139  135 - 145 mEq/L Final  . Potassium 08/28/2015 4.0  3.5 - 5.1 mEq/L Final  . Chloride 08/28/2015 104  96 - 112 mEq/L Final  . CO2 08/28/2015 31  19 - 32 mEq/L Final  . Glucose, Bld 08/28/2015 108* 70 - 99 mg/dL Final  . BUN 08/28/2015 16  6 - 23 mg/dL Final  . Creatinine, Ser 08/28/2015 0.84  0.40 - 1.20 mg/dL Final  . Total Bilirubin 08/28/2015 0.4  0.2 - 1.2 mg/dL Final  . Alkaline Phosphatase 08/28/2015 44  39 - 117 U/L Final  . AST 08/28/2015 27  0 - 37 U/L Final  . ALT 08/28/2015 26  0 - 35 U/L Final  . Total Protein 08/28/2015 7.5  6.0 - 8.3 g/dL Final  . Albumin 08/28/2015 4.2  3.5 - 5.2 g/dL Final  . Calcium 08/28/2015 9.3  8.4 - 10.5 mg/dL Final  . GFR 08/28/2015 91.17  >60.00 mL/min Final      Medication List       This list is accurate as of: 08/31/15 11:59 PM.  Always use  your most recent med list.               aspirin 325 MG tablet  Take 325 mg by mouth daily.     atorvastatin 10 MG tablet  Commonly known as:  LIPITOR  TAKE 1 TABLET (10 MG TOTAL) BY MOUTH DAILY.     B-COMPLEX-C PO  Take 1 tablet by mouth daily.     fluticasone 50 MCG/ACT nasal spray  Commonly known as:  FLONASE  Place 1 spray into both nostrils daily.     Insulin Pen Needle 32G X 4 MM Misc  Commonly known as:  BD PEN NEEDLE NANO U/F  Use one per day with Victoza     Liraglutide 18 MG/3ML Sopn  Commonly known as:  VICTOZA  Inject 0.3 mLs (1.8 mg total) into the skin daily.     niacin 500 MG tablet  Commonly known as:  SLO-NIACIN  Take 500 mg by mouth 2 (two) times daily with a meal.     nystatin-triamcinolone cream  Commonly known as:  MYCOLOG II     Turmeric 500 MG Caps  Take by mouth.     vitamin B-12 1000 MCG tablet  Commonly known as:  CYANOCOBALAMIN  Take 1,000 mcg by mouth daily.     Vitamin D3 2000  units Tabs  Take 1 tablet by mouth daily.     vitamin E 400 UNIT capsule  Generic drug:  vitamin E  Take 400 Units by mouth daily.        Allergies: No Known Allergies  Past Medical History  Diagnosis Date  . Thrombosed external hemorrhoid 08/15/85  . H/O varicella   . History of measles, mumps, or rubella   . Diabetes mellitus   . Bladder infection   . Fibroid   . Monilia infection 07/10/08  . Vagina itching 07/10/08  . Vulvitis 08/01/08  . Infertility   . Thyroid enlargement     Past Surgical History  Procedure Laterality Date  . Myomectomy abdominal approach  1977    Family History  Problem Relation Age of Onset  . Cancer Mother     Breast  . Cancer Father     Brain  . Hypertension Brother     Social History:  reports that she has quit smoking. She has never used smokeless tobacco. She reports that she does not drink alcohol or use illicit drugs.  Review of Systems:  Hypertension:  none, blood pressure again normal  Lipids: Has had diabetic dyslipidemia with increased particle number and triglycerides, has been on combination with statins and OTC niacin 500 mg once a day which was increased to twice a day on the last visit She is tolerating this and taking aspirin 30 minutes before  LDL particle number recently had gone up to 1144, previously below 1000   Lab Results  Component Value Date   CHOL 144 12/13/2014   HDL 42.90 12/13/2014   LDLCALC 77 12/13/2014   TRIG 121.0 12/13/2014   CHOLHDL 3 12/13/2014     Last diabetic foot exam was done in 6/16, normal  She has had her influenza vaccine   Examination:   BP 114/70 mmHg  Pulse 75  Temp(Src) 98.3 F (36.8 C)  Resp 14  Ht 5\' 7"  (1.702 m)  Wt 230 lb 12.8 oz (104.69 kg)  BMI 36.14 kg/m2  SpO2 96%  Body mass index is 36.14 kg/(m^2).    ASSESSMENT/ PLAN:   Diabetes type 2   Blood glucose  control has been good with upper normal A1c, now 6.3 This is despite poor compliance with controlling  her portions and types of foods she is eating She has done a little better with exercise but has been doing this intermittently Continues to take Victoza  She does agree to start exercising off from her work now Since her insurance does not cover any weight loss medications emphasize the need for better lifestyle   HYPERLIPIDEMIA: Needs follow-up, now taking 1000 mg of niacin for high LDL particle number  Patient Instructions  Check blood sugars on waking up 1-2  times a week Also check blood sugars about 2 hours after a meal and do this after different meals by rotation  Recommended blood sugar levels on waking up is 90-130 and about 2 hours after meal is 130-160  Please bring your blood sugar monitor to each visit, thank you  Restart walking and go after work      [facility] 03/19/2016, 12:15 PM

## 2015-11-06 ENCOUNTER — Other Ambulatory Visit: Payer: Self-pay

## 2015-11-06 DIAGNOSIS — Z1231 Encounter for screening mammogram for malignant neoplasm of breast: Secondary | ICD-10-CM

## 2015-11-27 ENCOUNTER — Ambulatory Visit
Admission: RE | Admit: 2015-11-27 | Discharge: 2015-11-27 | Disposition: A | Discharge status: Home / Self Care | Payer: 59 | Source: Ambulatory Visit

## 2015-11-27 DIAGNOSIS — Z1231 Encounter for screening mammogram for malignant neoplasm of breast: Secondary | ICD-10-CM

## 2016-01-24 ENCOUNTER — Other Ambulatory Visit: Payer: Self-pay | Admitting: Endocrinology

## 2016-02-25 ENCOUNTER — Other Ambulatory Visit (INDEPENDENT_AMBULATORY_CARE_PROVIDER_SITE_OTHER): Payer: 59

## 2016-02-25 DIAGNOSIS — E119 Type 2 diabetes mellitus without complications: Secondary | ICD-10-CM | POA: Diagnosis not present

## 2016-02-25 DIAGNOSIS — E785 Hyperlipidemia, unspecified: Secondary | ICD-10-CM

## 2016-02-25 DIAGNOSIS — E669 Obesity, unspecified: Secondary | ICD-10-CM

## 2016-02-25 LAB — BASIC METABOLIC PANEL
BUN: 14 mg/dL (ref 6–23)
CO2: 28 mEq/L (ref 19–32)
Calcium: 9.2 mg/dL (ref 8.4–10.5)
Chloride: 102 mEq/L (ref 96–112)
Creatinine, Ser: 0.78 mg/dL (ref 0.40–1.20)
GFR: 99.12 mL/min (ref >60.00)
Glucose, Bld: 99 mg/dL (ref 70–99)
Potassium: 4 mEq/L (ref 3.5–5.1)
Sodium: 138 mEq/L (ref 135–145)

## 2016-02-25 LAB — HEMOGLOBIN A1C: Hgb A1c MFr Bld: 6.5 % (ref 4.6–6.5)

## 2016-02-26 ENCOUNTER — Other Ambulatory Visit: Payer: Self-pay

## 2016-02-26 LAB — LIPOPROTEIN ANALYSIS BY NMR
HDL Particle Number: 37.8 umol/L (ref >=30.5)
LDL Particle Number: 1016 nmol/L — ABNORMAL HIGH (ref <1000)
LDL Size: 20.1 nm (ref >20.5)
LP-IR Score: 89 — ABNORMAL HIGH (ref <=45)
Small LDL Particle Number: 681 nmol/L — ABNORMAL HIGH (ref <=527)

## 2016-02-29 ENCOUNTER — Ambulatory Visit: Payer: Self-pay | Admitting: Endocrinology

## 2016-03-18 ENCOUNTER — Encounter: Payer: Self-pay | Admitting: Endocrinology

## 2016-03-18 ENCOUNTER — Ambulatory Visit (INDEPENDENT_AMBULATORY_CARE_PROVIDER_SITE_OTHER): Payer: 59 | Admitting: Endocrinology

## 2016-03-18 VITALS — BP 136/77 | HR 62 | Temp 97.9°F | Resp 14 | Ht 67.0 in | Wt 222.2 lb

## 2016-03-18 DIAGNOSIS — E785 Hyperlipidemia, unspecified: Secondary | ICD-10-CM | POA: Diagnosis not present

## 2016-03-18 DIAGNOSIS — E119 Type 2 diabetes mellitus without complications: Secondary | ICD-10-CM

## 2016-03-18 NOTE — Progress Notes (Signed)
Patient ID: Alicia Murray, female   DOB: 18-Nov-1960, 55 y.o.   MRN: [id number]   Reason for Appointment: Diabetes follow-up   History of Present Illness   Diagnosis: Type 2 DIABETES MELITUS, date of diagnosis:  2007     Previous history: She had significant hyperglycemia diagnosis and was treated with insulin and metformin. However with efforts to lose weight she was able to be treated without medications for sometime. She currently was started on metformin but because of her progressive weight gain she was switched to Victoza with good success and has been on this since 2/14. Currently detailed records are not available  Recent history:  Her blood sugars are reasonably well controlled despite her poor compliance with diet and inadequate level of exercise. A1c is upper normal at 6.5, previously  6.3  She has lost a little more weight since her last visit She does not check her blood sugars at home despite reminders, lab glucose 90 9 in the morning  She likes to eat sweets and has difficulty with compliance despite continuing Victoza 1.8 mg.  Her insurance does not cover Saxenda Also has not checked her blood sugar since her last visit She does not exercise.  She does work night shifts     Side effects from medications: None       Monitors blood glucose:  none recently   Glucometer: One Touch.          Blood Glucose readings : None    Meals: 2 meals per day.          Physical activity: exercise: none recently                Complications: are: None   Wt Readings from Last 3 Encounters:  03/18/16 222 lb 3.2 oz (100.789 kg)  08/31/15 230 lb 12.8 oz (104.69 kg)  05/01/15 232 lb (105.235 kg)    LABS:  Lab Results  Component Value Date   HGBA1C 6.5 02/25/2016   HGBA1C 6.3 08/28/2015   HGBA1C 6.3 04/27/2015   Lab Results  Component Value Date   MICROALBUR 5.5* 04/27/2015   LDLCALC 77 12/13/2014   CREATININE 0.78 02/25/2016    No visits with results within  1 Week(s) from this visit. Latest known visit with results is:  Lab on 02/25/2016  Component Date Value Ref Range Status  . Hgb A1c MFr Bld 02/25/2016 6.5  4.6 - 6.5 % Final   Glycemic Control Guidelines for People with Diabetes:Non Diabetic:  <6%Goal of Therapy: <7%Additional Action Suggested:  >8%   . Sodium 02/25/2016 138  135 - 145 mEq/L Final  . Potassium 02/25/2016 4.0  3.5 - 5.1 mEq/L Final  . Chloride 02/25/2016 102  96 - 112 mEq/L Final  . CO2 02/25/2016 28  19 - 32 mEq/L Final  . Glucose, Bld 02/25/2016 99  70 - 99 mg/dL Final  . BUN 02/25/2016 14  6 - 23 mg/dL Final  . Creatinine, Ser 02/25/2016 0.78  0.40 - 1.20 mg/dL Final  . Calcium 02/25/2016 9.2  8.4 - 10.5 mg/dL Final  . GFR 02/25/2016 99.12  >60.00 mL/min Final  . LDL Particle Number 02/25/2016 [id number]* <1000 nmol/L Final   Comment:                           Low                   < 1000  Moderate         1000 - 1299                           Borderline-High  1300 - 1599                           High             1600 - 2000                           Very High             > 2000   . HDL Particle Number 02/25/2016 37.8  >=30.5 umol/L Final  . Small LDL Particle Number 02/25/2016 [id number]* <=527 nmol/L Final  . LDL Size 02/25/2016 20.1  >20.5 nm Final   Comment:  ----------------------------------------------------------                  ** INTERPRETATIVE INFORMATION**                  PARTICLE CONCENTRATION AND SIZE                     <--Lower CVD Risk   Higher CVD Risk-->   LDL AND HDL PARTICLES   Percentile in Reference Population   HDL-P (total)        High     75th    50th    25th   Low                        >34.9    34.9    30.5    26.7   <26.7   Small LDL-P          Low      25th    50th    75th   High                        <117     117     527     839    >839   LDL Size   <-Large (Pattern A)->    <-Small (Pattern B)->                     23.0    20.6           20.5      19.0   ---------------------------------------------------------- Small LDL-P and LDL Size are associated with CVD risk, but not after LDL-P is taken into account. These assays were developed and their performance characteristics determined by [facility]. These assays have not been cleared by the [place] Food and Drug Administration. The clinical utility o                          f these laboratory values have not been fully established.   Marland Kitchen LP-IR Score 02/25/2016 89* <=45 Final   Comment: INSULIN RESISTANCE MARKER     <--Insulin Sensitive    Insulin Resistant-->            Percentile in Reference Population Insulin Resistance Score LP-IR Score   Low   25th   50th   75th   High               <  27   27     45     63     >63 LP-IR Score is inaccurate if patient is non-fasting. The LP-IR score is a laboratory developed index that has been associated with insulin resistance and diabetes risk and should be used as one component of a physician's clinical assessment. The LP-IR score listed above has not been cleared by the [place] Food and Drug Administration.       Medication List       This list is accurate as of: 03/18/16 11:59 PM.  Always use your most recent med list.               aspirin 325 MG tablet  Take 325 mg by mouth daily.     atorvastatin 10 MG tablet  Commonly known as:  LIPITOR  TAKE 1 TABLET BY MOUTH EVERY DAY     B-COMPLEX-C PO  Take 1 tablet by mouth daily.     Evening Primrose Oil 1000 MG Caps  Take by mouth.     Fish Oil 1000 MG Caps  Take by mouth.     fluticasone 50 MCG/ACT nasal spray  Commonly known as:  FLONASE  Place 1 spray into both nostrils daily.     Insulin Pen Needle 32G X 4 MM Misc  Commonly known as:  BD PEN NEEDLE NANO U/F  Use one per day with Victoza     niacin 500 MG tablet  Commonly known as:  SLO-NIACIN  Take 500 mg by mouth 2 (two) times daily with a meal.     nystatin-triamcinolone cream  Commonly known as:  MYCOLOG II     ONE TOUCH  ULTRA TEST test strip  Generic drug:  glucose blood  1 each by Other route 2 (two) times daily. Use as instructed     phentermine 37.5 MG tablet  Commonly known as:  ADIPEX-P     topiramate 50 MG tablet  Commonly known as:  TOPAMAX  1 TABLET(S) BY MOUTH IN THE MORNING     Turmeric 500 MG Caps  Take by mouth.     VICTOZA 18 MG/3ML Sopn  Generic drug:  Liraglutide  INJECT 0.3 MLS (1.8 MG TOTAL) INTO THE SKIN DAILY.     vitamin B-12 1000 MCG tablet  Commonly known as:  CYANOCOBALAMIN  Take 1,000 mcg by mouth daily.     Vitamin D3 2000 units Tabs  Take 1 tablet by mouth daily.     vitamin E 400 UNIT capsule  Generic drug:  vitamin E  Take 400 Units by mouth daily.        Allergies: No Known Allergies  Past Medical History  Diagnosis Date  . Thrombosed external hemorrhoid 08/15/85  . H/O varicella   . History of measles, mumps, or rubella   . Diabetes mellitus   . Bladder infection   . Fibroid   . Monilia infection 07/10/08  . Vagina itching 07/10/08  . Vulvitis 08/01/08  . Infertility   . Thyroid enlargement     Past Surgical History  Procedure Laterality Date  . Myomectomy abdominal approach  1977    Family History  Problem Relation Age of Onset  . Cancer Mother     Breast  . Cancer Father     Brain  . Hypertension Brother     Social History:  reports that she has quit smoking. She has never used smokeless tobacco. She reports that she does not drink alcohol or use illicit  drugs.  Review of Systems:   Lipids: Has had diabetic dyslipidemia with increased particle number and triglycerides, has been on combination with statins and OTC niacin 500 mg  twice a day She is tolerating this regimen and taking aspirin 30 minutes before niacin  LDL particle number is now 1016 ,had gone up to 1144, previously below 1000   Lab Results  Component Value Date   CHOL 144 12/13/2014   HDL 42.90 12/13/2014   LDLCALC 77 12/13/2014   TRIG 121.0 12/13/2014   CHOLHDL  3 12/13/2014     Last diabetic foot exam was done in 6/16, normal    Examination:   BP 136/77 mmHg  Pulse 62  Temp(Src) 97.9 F (36.6 C)  Resp 14  Ht 5\' 7"  (1.702 m)  Wt 222 lb 3.2 oz (100.789 kg)  BMI 34.79 kg/m2  SpO2 97%  Body mass index is 34.79 kg/(m^2).    ASSESSMENT/ PLAN:   Diabetes type 2   Blood glucose control has been fairly good with upper normal A1c, now 6.5 This is despite poor compliance with controlling her portions and types of foods she is eating  she does not exercise now despite reminders. Continues to take Victoza  Since her insurance does not cover any weight loss medications she is taking phentermine and Topamax from PCP However she does not think this curtailing her appetite and she is eating because she likes to eat per Discussed that high-dose phentermine is not recommended long-term and she did not have any significant success with this previously and would prefer that she not take this  Recommended that she start exercising regularly, she thinks she can now start walking  HYPERLIPIDEMIA:  Relatively better particle number but particle sizes still abnormal. Does need weight loss and better diet  She will continue niacin and Lipitor  Patient Instructions  Exercise daily     Tynleigh Birt 03/19/2016, 12:19 PM

## 2016-03-18 NOTE — Patient Instructions (Signed)
Exercise daily 

## 2016-04-22 ENCOUNTER — Other Ambulatory Visit: Payer: Self-pay | Admitting: Endocrinology

## 2016-05-09 ENCOUNTER — Other Ambulatory Visit: Payer: Self-pay

## 2016-05-09 MED ORDER — LIRAGLUTIDE 18 MG/3ML ~~LOC~~ SOPN: PEN_INJECTOR | SUBCUTANEOUS | Status: DC

## 2016-05-09 MED ORDER — ATORVASTATIN CALCIUM 10 MG PO TABS: 10.0000 mg | ORAL_TABLET | Freq: Every day | ORAL | Status: DC

## 2016-08-26 ENCOUNTER — Other Ambulatory Visit: Payer: Self-pay | Admitting: Endocrinology

## 2016-09-11 ENCOUNTER — Other Ambulatory Visit (INDEPENDENT_AMBULATORY_CARE_PROVIDER_SITE_OTHER): Payer: 59

## 2016-09-11 DIAGNOSIS — E119 Type 2 diabetes mellitus without complications: Secondary | ICD-10-CM

## 2016-09-11 LAB — COMPREHENSIVE METABOLIC PANEL
ALT: 21 U/L (ref 0–35)
AST: 24 U/L (ref 0–37)
Albumin: 4.3 g/dL (ref 3.5–5.2)
Alkaline Phosphatase: 41 U/L (ref 39–117)
BUN: 13 mg/dL (ref 6–23)
CO2: 29 mEq/L (ref 19–32)
Calcium: 9.4 mg/dL (ref 8.4–10.5)
Chloride: 102 mEq/L (ref 96–112)
Creatinine, Ser: 0.81 mg/dL (ref 0.40–1.20)
GFR: 94.7 mL/min (ref >60.00)
Glucose, Bld: 93 mg/dL (ref 70–99)
Potassium: 3.9 mEq/L (ref 3.5–5.1)
Sodium: 138 mEq/L (ref 135–145)
Total Bilirubin: 0.6 mg/dL (ref 0.2–1.2)
Total Protein: 7.2 g/dL (ref 6.0–8.3)

## 2016-09-11 LAB — LIPID PANEL
Cholesterol: 156 mg/dL (ref 0–200)
HDL: 50.8 mg/dL (ref >39.00)
LDL Cholesterol: 86 mg/dL (ref 0–99)
NonHDL: 104.89
Total CHOL/HDL Ratio: 3
Triglycerides: 93 mg/dL (ref 0.0–149.0)
VLDL: 18.6 mg/dL (ref 0.0–40.0)

## 2016-09-11 LAB — HEMOGLOBIN A1C: Hgb A1c MFr Bld: 6.3 % (ref 4.6–6.5)

## 2016-09-11 LAB — MICROALBUMIN / CREATININE URINE RATIO
Creatinine,U: 158 mg/dL
Microalb Creat Ratio: 2.3 mg/g (ref 0.0–30.0)
Microalb, Ur: 3.7 mg/dL — ABNORMAL HIGH (ref 0.0–1.9)

## 2016-09-15 ENCOUNTER — Ambulatory Visit: Payer: Self-pay | Admitting: Endocrinology

## 2016-09-17 ENCOUNTER — Other Ambulatory Visit: Payer: Self-pay

## 2016-09-17 ENCOUNTER — Encounter: Payer: Self-pay | Admitting: Endocrinology

## 2016-09-17 ENCOUNTER — Ambulatory Visit (INDEPENDENT_AMBULATORY_CARE_PROVIDER_SITE_OTHER): Payer: 59 | Admitting: Endocrinology

## 2016-09-17 VITALS — BP 122/82 | HR 76 | Ht 67.0 in | Wt 236.0 lb

## 2016-09-17 DIAGNOSIS — E782 Mixed hyperlipidemia: Secondary | ICD-10-CM

## 2016-09-17 DIAGNOSIS — E119 Type 2 diabetes mellitus without complications: Secondary | ICD-10-CM

## 2016-09-17 MED ORDER — GLUCOSE BLOOD VI STRP: 1.0000 | ORAL_STRIP | Freq: Two times a day (BID) | 3 refills | Status: DC

## 2016-09-17 MED ORDER — TOPIRAMATE 50 MG PO TABS: ORAL_TABLET | ORAL | 2 refills | Status: DC

## 2016-09-17 MED ORDER — PHENTERMINE HCL 15 MG PO CAPS: 15.0000 mg | ORAL_CAPSULE | ORAL | 2 refills | Status: DC

## 2016-09-17 MED ORDER — ONETOUCH LANCETS MISC: 1.0000 | Freq: Two times a day (BID) | 1 refills | Status: AC

## 2016-09-17 MED ORDER — BUPROPION HCL ER (XL) 150 MG PO TB24: 150.0000 mg | ORAL_TABLET | Freq: Every day | ORAL | 3 refills | Status: DC

## 2016-09-17 NOTE — Progress Notes (Signed)
Patient ID: Alicia Murray, female   DOB: Nov 01, 1961, 55 y.o.   MRN: [id number]   Reason for Appointment: Diabetes follow-up   History of Present Illness   Diagnosis: Type 2 DIABETES MELITUS, date of diagnosis:  2007     Previous history: She had significant hyperglycemia diagnosis and was treated with insulin and metformin. However with efforts to lose weight she was able to be treated without medications for sometime. She currently was started on metformin but because of her progressive weight gain she was switched to Victoza with good success and has been on this since 2/14. Currently detailed records are not available  Recent history:  Her blood sugars are reasonably well controlled despite her poor compliance with diet and inadequate level of exercise. A1c is upper normal at  6.3   She does not have test strips and has not monitored her blood sugar Although she had lost weight previously with taking phentermine she has gained back all of this and more  She likes to eat sweets and has difficulty with compliance despite continuing Victoza 1.8 mg.   Her insurance does not cover Saxenda  She does not exercise regularly.  After her last visit she has started walking with a partner but not for some time Does not appear motivated even though she has access to a gym   She does work night shifts     Side effects from medications: None       Monitors blood glucose:  none recently   Glucometer: One Touch.          Blood Glucose readings : None    Meals: 2 meals per day.          Physical activity: exercise: none recently                Complications: are: None   WEIGHT control:  Her PCP had given her full doses of phentermine and also Topamax previously She says that this did not change her appetite and she continued to do poorly with her diet but did however lose weight She now says that she may be depressed and eating out of depression at times especially sweets PCP  has not offered her any antidepressants  Wt Readings from Last 3 Encounters:  09/17/16 236 lb (107 kg)  03/18/16 222 lb 3.2 oz (100.8 kg)  08/31/15 230 lb 12.8 oz (104.7 kg)    LABS:  Lab Results  Component Value Date   HGBA1C 6.3 09/11/2016   HGBA1C 6.5 02/25/2016   HGBA1C 6.3 08/28/2015   Lab Results  Component Value Date   MICROALBUR 3.7 (H) 09/11/2016   LDLCALC 86 09/11/2016   CREATININE 0.81 09/11/2016    Lab on 09/11/2016  Component Date Value Ref Range Status  . Hgb A1c MFr Bld 09/11/2016 6.3  4.6 - 6.5 % Final  . Sodium 09/11/2016 138  135 - 145 mEq/L Final  . Potassium 09/11/2016 3.9  3.5 - 5.1 mEq/L Final  . Chloride 09/11/2016 102  96 - 112 mEq/L Final  . CO2 09/11/2016 29  19 - 32 mEq/L Final  . Glucose, Bld 09/11/2016 93  70 - 99 mg/dL Final  . BUN 09/11/2016 13  6 - 23 mg/dL Final  . Creatinine, Ser 09/11/2016 0.81  0.40 - 1.20 mg/dL Final  . Total Bilirubin 09/11/2016 0.6  0.2 - 1.2 mg/dL Final  . Alkaline Phosphatase 09/11/2016 41  39 - 117 U/L Final  . AST 09/11/2016 24  0 -  37 U/L Final  . ALT 09/11/2016 21  0 - 35 U/L Final  . Total Protein 09/11/2016 7.2  6.0 - 8.3 g/dL Final  . Albumin 09/11/2016 4.3  3.5 - 5.2 g/dL Final  . Calcium 09/11/2016 9.4  8.4 - 10.5 mg/dL Final  . GFR 09/11/2016 94.70  >60.00 mL/min Final  . Microalb, Ur 09/11/2016 3.7* 0.0 - 1.9 mg/dL Final  . Creatinine,U 09/11/2016 158.0  mg/dL Final  . Microalb Creat Ratio 09/11/2016 2.3  0.0 - 30.0 mg/g Final  . Cholesterol 09/11/2016 156  0 - 200 mg/dL Final  . Triglycerides 09/11/2016 93.0  0.0 - 149.0 mg/dL Final  . HDL 09/11/2016 50.80  >39.00 mg/dL Final  . VLDL 09/11/2016 18.6  0.0 - 40.0 mg/dL Final  . LDL Cholesterol 09/11/2016 86  0 - 99 mg/dL Final  . Total CHOL/HDL Ratio 09/11/2016 3   Final  . NonHDL 09/11/2016 104.89   Final      Medication List       Accurate as of 09/17/16 11:59 PM. Always use your most recent med list.          aspirin 325 MG tablet Take  325 mg by mouth daily.   atorvastatin 10 MG tablet Commonly known as:  LIPITOR Take 1 tablet (10 mg total) by mouth daily.   B-COMPLEX-C PO Take 1 tablet by mouth daily.   BIOTIN MAXIMUM STRENGTH 10 MG Tabs Generic drug:  Biotin Take 1,000 mg by mouth. Once daily   buPROPion 150 MG 24 hr tablet Commonly known as:  WELLBUTRIN XL Take 1 tablet (150 mg total) by mouth daily.   Evening Primrose Oil 1000 MG Caps Take by mouth.   Fish Oil 1000 MG Caps Take by mouth.   fluticasone 50 MCG/ACT nasal spray Commonly known as:  FLONASE PLACE 1 SPRAY INTO BOTH NOSTRILS DAILY.   glucose blood test strip Commonly known as:  ONE TOUCH ULTRA TEST 1 each by Other route 2 (two) times daily. Use as instructed   Insulin Pen Needle 32G X 4 MM Misc Commonly known as:  BD PEN NEEDLE NANO U/F Use one per day with Victoza   liraglutide 18 MG/3ML Sopn Commonly known as:  VICTOZA INJECT 0.3 MLS (1.8 MG TOTAL) INTO THE SKIN DAILY.   niacin 500 MG tablet Commonly known as:  SLO-NIACIN Take 500 mg by mouth 2 (two) times daily with a meal.   nystatin-triamcinolone cream Commonly known as:  MYCOLOG II   ONE TOUCH LANCETS Misc 1 each by Does not apply route 2 (two) times daily.   Turmeric 500 MG Caps Take by mouth.   vitamin B-12 1000 MCG tablet Commonly known as:  CYANOCOBALAMIN Take 1,000 mcg by mouth daily.   Vitamin D3 2000 units Tabs Take 1 tablet by mouth daily.   vitamin E 400 UNIT capsule Generic drug:  vitamin E Take 400 Units by mouth daily.       Allergies: No Known Allergies  Past Medical History:  Diagnosis Date  . Bladder infection   . Diabetes mellitus   . Fibroid   . H/O varicella   . History of measles, mumps, or rubella   . Infertility   . Monilia infection 07/10/08  . Thrombosed external hemorrhoid 08/15/85  . Thyroid enlargement   . Vagina itching 07/10/08  . Vulvitis 08/01/08    Past Surgical History:  Procedure Laterality Date  . MYOMECTOMY  ABDOMINAL APPROACH  1977    Family History  Problem Relation Age  of Onset  . Cancer Mother     Breast  . Cancer Father     Brain  . Hypertension Brother     Social History:  reports that she has quit smoking. She has never used smokeless tobacco. She reports that she does not drink alcohol or use drugs.  Review of Systems:   Lipids:  Has had diabetic dyslipidemia with increased particle number and triglycerides, Is on combination with statins and OTC niacin 500 mg  twice a day She is tolerating this regimen Although still having some hot flashes despite taking aspirin 30 minutes before niacin  LDL particle number previously was 1016 ,had gone up to 1144, previously below 1000 LDL is still below 100  Lab Results  Component Value Date   CHOL 156 09/11/2016   HDL 50.80 09/11/2016   LDLCALC 86 09/11/2016   TRIG 93.0 09/11/2016   CHOLHDL 3 09/11/2016     Last diabetic foot exam was done in 11/17, normal    Examination:   BP 122/82   Pulse 76   Ht 5\' 7"  (1.702 m)   Wt 236 lb (107 kg)   SpO2 97%   BMI 36.96 kg/m   Body mass index is 36.96 kg/m.   Diabetic Foot Exam - Simple   Simple Foot Form Diabetic Foot exam was performed with the following findings:  Yes 09/17/2016  4:18 PM  Visual Inspection No deformities, no ulcerations, no other skin breakdown bilaterally:  Yes Sensation Testing Intact to touch and monofilament testing bilaterally:  Yes Pulse Check Posterior Tibialis and Dorsalis pulse intact bilaterally:  Yes Comments     ASSESSMENT/ PLAN:   Diabetes type 2   Blood glucose control has been fairly good with upper normal A1c, has been consistently 6.5 or lower This is despite poor compliance with controlling her portions and types of foods she is eating  Again she does not exercise now despite reminders. Continues to take Victoza with control of her glucose  Since she thinks she is eating sweets out of depression and does not improve her diet when  she is taking weight loss drugs will give her a trial of Wellbutrin SR 150 mg daily  Recommended that she start exercising regularly, she thinks she can try going to the gym  HYPERLIPIDEMIA:   Lipids are well controlled although LDL relatively higher than before Dyspnea improved with better diet   She will continue niacin and Lipitor  There are no Patient Instructions on file for this visit.   Kojo Liby 09/18/2016, 8:00 AM

## 2016-11-23 ENCOUNTER — Other Ambulatory Visit: Payer: Self-pay | Admitting: Endocrinology

## 2016-12-16 ENCOUNTER — Other Ambulatory Visit: Payer: Self-pay

## 2016-12-19 ENCOUNTER — Ambulatory Visit: Payer: Self-pay | Admitting: Endocrinology

## 2017-02-11 ENCOUNTER — Other Ambulatory Visit: Payer: Self-pay | Admitting: Family

## 2017-02-11 DIAGNOSIS — Z1231 Encounter for screening mammogram for malignant neoplasm of breast: Secondary | ICD-10-CM

## 2017-02-12 ENCOUNTER — Other Ambulatory Visit: Payer: Self-pay

## 2017-02-13 ENCOUNTER — Other Ambulatory Visit (INDEPENDENT_AMBULATORY_CARE_PROVIDER_SITE_OTHER): Payer: 59

## 2017-02-13 DIAGNOSIS — E119 Type 2 diabetes mellitus without complications: Secondary | ICD-10-CM | POA: Diagnosis not present

## 2017-02-13 LAB — COMPREHENSIVE METABOLIC PANEL
ALT: 28 U/L (ref 0–35)
AST: 31 U/L (ref 0–37)
Albumin: 4.1 g/dL (ref 3.5–5.2)
Alkaline Phosphatase: 46 U/L (ref 39–117)
BUN: 13 mg/dL (ref 6–23)
CO2: 31 mEq/L (ref 19–32)
Calcium: 9.2 mg/dL (ref 8.4–10.5)
Chloride: 103 mEq/L (ref 96–112)
Creatinine, Ser: 0.8 mg/dL (ref 0.40–1.20)
GFR: 95.92 mL/min (ref >60.00)
Glucose, Bld: 116 mg/dL — ABNORMAL HIGH (ref 70–99)
Potassium: 4 mEq/L (ref 3.5–5.1)
Sodium: 139 mEq/L (ref 135–145)
Total Bilirubin: 0.5 mg/dL (ref 0.2–1.2)
Total Protein: 7.2 g/dL (ref 6.0–8.3)

## 2017-02-13 LAB — HEMOGLOBIN A1C: Hgb A1c MFr Bld: 6.8 % — ABNORMAL HIGH (ref 4.6–6.5)

## 2017-02-17 ENCOUNTER — Ambulatory Visit (INDEPENDENT_AMBULATORY_CARE_PROVIDER_SITE_OTHER): Payer: 59 | Admitting: Endocrinology

## 2017-02-17 ENCOUNTER — Encounter: Payer: Self-pay | Admitting: Endocrinology

## 2017-02-17 VITALS — BP 134/76 | HR 61 | Ht 67.0 in | Wt 243.0 lb

## 2017-02-17 DIAGNOSIS — E1165 Type 2 diabetes mellitus with hyperglycemia: Secondary | ICD-10-CM | POA: Diagnosis not present

## 2017-02-17 NOTE — Progress Notes (Signed)
Patient ID: Alicia Murray, female   DOB: 1961-01-20, 56 y.o.   MRN: [id number]   Reason for Appointment: Diabetes follow-up   History of Present Illness   Diagnosis: Type 2 DIABETES MELITUS, date of diagnosis:  2007     Previous history: She had significant hyperglycemia diagnosis and was treated with insulin and metformin. However with efforts to lose weight she was able to be treated without medications for sometime. She currently was started on metformin but because of her progressive weight gain she was switched to Victoza with good success and has been on this since 2/14. Currently detailed records are not available  Recent history:  She has relatively higher A1c of 6.8 now with continued  poor compliance with diet and inadequate level of exercise.  She again has not monitored her blood sugar She likes to eat sweets and has difficulty with watching her diet despite continuing Victoza 1.8 mg. On her last with this she was stating that she was eating because of depression Although she had lost weight previously with taking phentermine she has gained back all of this and more  She does not exercise regularly.   Does not appear motivated even though she has access to a gym   She does work night shifts     Side effects from medications: None       Monitors blood glucose:  none recently   Glucometer: One Touch.          Blood Glucose readings : None    Meals: 2 meals per day.          Physical activity: exercise: none recently                 WEIGHT control:  She was told to try Wellbutrin on her last visit because of her stress eating but she did not try for fear of side effects PCP has not offered her any antidepressants She tends to lose some weight with phentermine 37.5 mg given by her PCP but when she stopped this her weight rebound back to baseline   Wt Readings from Last 3 Encounters:  02/17/17 243 lb (110.2 kg)  09/17/16 236 lb (107 kg)  03/18/16 222  lb 3.2 oz (100.8 kg)    LABS:  Lab Results  Component Value Date   HGBA1C 6.8 (H) 02/13/2017   HGBA1C 6.3 09/11/2016   HGBA1C 6.5 02/25/2016   Lab Results  Component Value Date   MICROALBUR 3.7 (H) 09/11/2016   LDLCALC 86 09/11/2016   CREATININE 0.80 02/13/2017    Lab on 02/13/2017  Component Date Value Ref Range Status  . Hgb A1c MFr Bld 02/13/2017 6.8* 4.6 - 6.5 % Final  . Sodium 02/13/2017 139  135 - 145 mEq/L Final  . Potassium 02/13/2017 4.0  3.5 - 5.1 mEq/L Final  . Chloride 02/13/2017 103  96 - 112 mEq/L Final  . CO2 02/13/2017 31  19 - 32 mEq/L Final  . Glucose, Bld 02/13/2017 116* 70 - 99 mg/dL Final  . BUN 02/13/2017 13  6 - 23 mg/dL Final  . Creatinine, Ser 02/13/2017 0.80  0.40 - 1.20 mg/dL Final  . Total Bilirubin 02/13/2017 0.5  0.2 - 1.2 mg/dL Final  . Alkaline Phosphatase 02/13/2017 46  39 - 117 U/L Final  . AST 02/13/2017 31  0 - 37 U/L Final  . ALT 02/13/2017 28  0 - 35 U/L Final  . Total Protein 02/13/2017 7.2  6.0 - 8.3 g/dL Final  .  Albumin 02/13/2017 4.1  3.5 - 5.2 g/dL Final  . Calcium 02/13/2017 9.2  8.4 - 10.5 mg/dL Final  . GFR 02/13/2017 95.92  >60.00 mL/min Final    Allergies as of 02/17/2017   No Known Allergies     Medication List       Accurate as of 02/17/17  9:27 PM. Always use your most recent med list.          aspirin 325 MG tablet Take 325 mg by mouth daily.   atorvastatin 10 MG tablet Commonly known as:  LIPITOR Take 1 tablet (10 mg total) by mouth daily.   B-COMPLEX-C PO Take 1 tablet by mouth daily.   BIOTIN MAXIMUM STRENGTH 10 MG Tabs Generic drug:  Biotin Take 1,000 mg by mouth. Once daily   buPROPion 150 MG 24 hr tablet Commonly known as:  WELLBUTRIN XL Take 1 tablet (150 mg total) by mouth daily.   Evening Primrose Oil 1000 MG Caps Take by mouth.   Fish Oil 1000 MG Caps Take by mouth.   fluticasone 50 MCG/ACT nasal spray Commonly known as:  FLONASE PLACE 1 SPRAY INTO BOTH NOSTRILS DAILY.   glucose  blood test strip Commonly known as:  ONE TOUCH ULTRA TEST 1 each by Other route 2 (two) times daily. Use as instructed   Insulin Pen Needle 32G X 4 MM Misc Commonly known as:  BD PEN NEEDLE NANO U/F Use one per day with Victoza   niacin 500 MG tablet Commonly known as:  SLO-NIACIN Take 500 mg by mouth 2 (two) times daily with a meal.   nystatin-triamcinolone cream Commonly known as:  MYCOLOG II   ONE TOUCH LANCETS Misc 1 each by Does not apply route 2 (two) times daily.   Turmeric 500 MG Caps Take by mouth.   VICTOZA 18 MG/3ML Sopn Generic drug:  liraglutide INJECT 0.3 MLS (1.8 MG TOTAL) INTO THE SKIN DAILY.   vitamin B-12 1000 MCG tablet Commonly known as:  CYANOCOBALAMIN Take 1,000 mcg by mouth daily.   Vitamin D3 2000 units Tabs Take 1 tablet by mouth daily.   vitamin E 400 UNIT capsule Generic drug:  vitamin E Take 400 Units by mouth daily.       Allergies: No Known Allergies  Past Medical History:  Diagnosis Date  . Bladder infection   . Diabetes mellitus   . Fibroid   . H/O varicella   . History of measles, mumps, or rubella   . Infertility   . Monilia infection 07/10/08  . Thrombosed external hemorrhoid 08/15/85  . Thyroid enlargement   . Vagina itching 07/10/08  . Vulvitis 08/01/08    Past Surgical History:  Procedure Laterality Date  . MYOMECTOMY ABDOMINAL APPROACH  1977    Family History  Problem Relation Age of Onset  . Cancer Mother     Breast  . Cancer Father     Brain  . Hypertension Brother     Social History:  reports that she has quit smoking. She has never used smokeless tobacco. She reports that she does not drink alcohol or use drugs.  Review of Systems:   Lipids:  Has had diabetic dyslipidemia with increased particle number and triglycerides, Is on combination with statins and OTC niacin 500 mg  twice a day She is tolerating this regimen Although still having some hot flashes despite taking aspirin 30 minutes before  niacin  LDL particle number previously was 1016 ,had gone up to 1144, previously below 1000 LDL is  below 100  Lab Results  Component Value Date   CHOL 156 09/11/2016   HDL 50.80 09/11/2016   LDLCALC 86 09/11/2016   TRIG 93.0 09/11/2016   CHOLHDL 3 09/11/2016     Last diabetic foot exam was done in 11/17, normal    Examination:   BP 134/76   Pulse 61   Ht 5\' 7"  (1.702 m)   Wt 243 lb (110.2 kg)   BMI 38.06 kg/m   Body mass index is 38.06 kg/m.     ASSESSMENT/ PLAN:   Diabetes type 2   See history of present illness for detailed discussion of current diabetes management, blood sugar patterns and problems identified  She continues to gain weight and now her A1c is starting to go up, recently 6.8 Although her fasting reading was fairly good in the lab she is likely to be getting postprandial hyperglycemia with her poor diet This is despite taking Victoza 1.8 mg She has poor motivation to watch her diet or exercise  Recommendations: She will start walking with her coworkers after work She will try Wellbutrin, reassured her that the chances of getting hallucinations as side effect are extremely rare and this will stop her stress eating hopefully  HYPERLIPIDEMIA:   Lipids will be rechecked on the next visit  Patient Instructions  Restart exercise    [facility] 02/17/2017, 9:27 PM

## 2017-02-17 NOTE — Patient Instructions (Signed)
Restart exercise ?

## 2017-03-03 ENCOUNTER — Ambulatory Visit
Admission: RE | Admit: 2017-03-03 | Discharge: 2017-03-03 | Disposition: A | Discharge status: Home / Self Care | Payer: 59 | Source: Ambulatory Visit | Attending: Family | Admitting: Family

## 2017-03-03 DIAGNOSIS — Z1231 Encounter for screening mammogram for malignant neoplasm of breast: Secondary | ICD-10-CM

## 2017-03-10 ENCOUNTER — Other Ambulatory Visit: Payer: Self-pay | Admitting: Endocrinology

## 2017-05-13 ENCOUNTER — Encounter (HOSPITAL_COMMUNITY): Payer: Self-pay | Admitting: Emergency Medicine

## 2017-05-13 ENCOUNTER — Ambulatory Visit (HOSPITAL_COMMUNITY)
Admission: EM | Admit: 2017-05-13 | Discharge: 2017-05-13 | Disposition: A | Discharge status: Home / Self Care | Payer: 59 | Attending: Family Medicine | Admitting: Family Medicine

## 2017-05-13 DIAGNOSIS — Z8249 Family history of ischemic heart disease and other diseases of the circulatory system: Secondary | ICD-10-CM | POA: Insufficient documentation

## 2017-05-13 DIAGNOSIS — Z803 Family history of malignant neoplasm of breast: Secondary | ICD-10-CM | POA: Diagnosis not present

## 2017-05-13 DIAGNOSIS — Z87891 Personal history of nicotine dependence: Secondary | ICD-10-CM | POA: Diagnosis not present

## 2017-05-13 DIAGNOSIS — H6502 Acute serous otitis media, left ear: Secondary | ICD-10-CM

## 2017-05-13 DIAGNOSIS — H9202 Otalgia, left ear: Secondary | ICD-10-CM | POA: Insufficient documentation

## 2017-05-13 DIAGNOSIS — H6592 Unspecified nonsuppurative otitis media, left ear: Secondary | ICD-10-CM | POA: Diagnosis not present

## 2017-05-13 DIAGNOSIS — Z794 Long term (current) use of insulin: Secondary | ICD-10-CM | POA: Diagnosis not present

## 2017-05-13 DIAGNOSIS — Z8619 Personal history of other infectious and parasitic diseases: Secondary | ICD-10-CM | POA: Insufficient documentation

## 2017-05-13 DIAGNOSIS — Z808 Family history of malignant neoplasm of other organs or systems: Secondary | ICD-10-CM | POA: Insufficient documentation

## 2017-05-13 DIAGNOSIS — J029 Acute pharyngitis, unspecified: Secondary | ICD-10-CM | POA: Diagnosis not present

## 2017-05-13 DIAGNOSIS — Z9889 Other specified postprocedural states: Secondary | ICD-10-CM | POA: Insufficient documentation

## 2017-05-13 DIAGNOSIS — E119 Type 2 diabetes mellitus without complications: Secondary | ICD-10-CM | POA: Diagnosis not present

## 2017-05-13 DIAGNOSIS — Z7982 Long term (current) use of aspirin: Secondary | ICD-10-CM | POA: Diagnosis not present

## 2017-05-13 LAB — POCT RAPID STREP A: Streptococcus, Group A Screen (Direct): NEGATIVE

## 2017-05-13 MED ORDER — PHENOL 1.4 % MT LIQD
1.0000 | OROMUCOSAL | 0 refills | Status: DC | PRN
Start: 2017-05-13 — End: 2017-05-13

## 2017-05-13 MED ORDER — AMOXICILLIN 500 MG PO CAPS: 500.0000 mg | ORAL_CAPSULE | Freq: Two times a day (BID) | ORAL | 0 refills | Status: AC

## 2017-05-13 MED ORDER — PHENOL 1.4 % MT LIQD: 1.0000 | OROMUCOSAL | 0 refills | Status: DC | PRN

## 2017-05-13 MED ORDER — AMOXICILLIN 500 MG PO CAPS: 500.0000 mg | ORAL_CAPSULE | Freq: Two times a day (BID) | ORAL | 0 refills | Status: DC

## 2017-05-13 NOTE — ED Triage Notes (Signed)
The patient presented to the [facility] with a complaint of a sore throat x 1 week that ha gotten worse in the last 3 days.

## 2017-05-13 NOTE — ED Provider Notes (Signed)
CSN: [id number]     Arrival date & time 05/13/17  1047 History   None    Chief Complaint  Patient presents with  . Sore Throat   (Consider location/radiation/quality/duration/timing/severity/associated sxs/prior Treatment) 56 yo female comes in with 1 week history of sore throat that has been worsening for the past 3 days. She states she has also had cough, rhinorrhea, and started having left ear pain a few days ago. She has had subjective fever. She has been taking otc sinus/flu medicine without relief. She stopped her medicines for sinus infections including flonase and allergy medicines because she didn't feel like it is helping. Her throat bothers her the most and is hard to swallow due to the pain. Denies trouble breathing, throat swelling shut, injury. Denies abdominal pain, N/V/D. No sick contact.       Past Medical History:  Diagnosis Date  . Bladder infection   . Diabetes mellitus   . Fibroid   . H/O varicella   . History of measles, mumps, or rubella   . Infertility   . Monilia infection 07/10/08  . Thrombosed external hemorrhoid 08/15/85  . Thyroid enlargement   . Vagina itching 07/10/08  . Vulvitis 08/01/08   Past Surgical History:  Procedure Laterality Date  . BREAST BIOPSY Left 08/08/2013  . MYOMECTOMY ABDOMINAL APPROACH  1977   Family History  Problem Relation Age of Onset  . Cancer Mother        Breast  . Breast cancer Mother   . Cancer Father        Brain  . Hypertension Brother    Social History  Substance Use Topics  . Smoking status: Former Research scientist (life sciences)  . Smokeless tobacco: Never Used  . Alcohol use No   OB History    Gravida Para Term Preterm AB Living   0 0 0 0 0 0   SAB TAB Ectopic Multiple Live Births   0 0 0 0       Review of Systems  Constitutional: Negative for chills, diaphoresis and fever.  HENT: Positive for congestion, ear pain, postnasal drip, rhinorrhea, sore throat, trouble swallowing and voice change. Negative for ear discharge, sinus  pain, sinus pressure and sneezing.   Eyes: Negative for photophobia, pain, discharge, redness, itching and visual disturbance.  Respiratory: Positive for choking. Negative for shortness of breath and wheezing.   Cardiovascular: Negative for chest pain and palpitations.  Gastrointestinal: Negative for abdominal pain, diarrhea, nausea and vomiting.    Allergies  Patient has no known allergies.  Home Medications   Prior to Admission medications   Medication Sig Start Date End Date Taking? Authorizing Provider  amoxicillin (AMOXIL) 500 MG capsule Take 1 capsule (500 mg total) by mouth 2 (two) times daily. 05/13/17 05/20/17  Ok Edwards, PA-C  aspirin 325 MG tablet Take 325 mg by mouth daily.    [provider]  atorvastatin (LIPITOR) 10 MG tablet TAKE 1 TABLET (10 MG TOTAL) BY MOUTH DAILY. 03/10/17   Elayne Snare, MD  B-COMPLEX-C PO Take 1 tablet by mouth daily.     [provider]  Biotin (BIOTIN MAXIMUM STRENGTH) 10 MG TABS Take 1,000 mg by mouth. Once daily    [provider]  Cholecalciferol (VITAMIN D3) 2000 UNITS TABS Take 1 tablet by mouth daily.     [provider]  Evening Primrose Oil 1000 MG CAPS Take by mouth.    [provider]  fluticasone (FLONASE) 50 MCG/ACT nasal spray PLACE 1 SPRAY INTO  BOTH NOSTRILS DAILY. 08/26/16   Elayne Snare, MD  glucose blood (ONE TOUCH ULTRA TEST) test strip 1 each by Other route 2 (two) times daily. Use as instructed 09/17/16   Elayne Snare, MD  Insulin Pen Needle (BD PEN NEEDLE NANO U/F) 32G X 4 MM MISC Use one per day with Victoza 04/27/15   Elayne Snare, MD  niacin (SLO-NIACIN) 500 MG tablet Take 500 mg by mouth 2 (two) times daily with a meal.    [provider]  nystatin-triamcinolone (MYCOLOG II) cream  08/03/14   [provider]  Omega-3 Fatty Acids (FISH OIL) 1000 MG CAPS Take by mouth.    [provider]  ONE TOUCH LANCETS MISC 1 each by Does not apply route 2 (two) times daily.  09/17/16   Elayne Snare, MD  phenol (CHLORASEPTIC) 1.4 % LIQD Use as directed 1 spray in the mouth or throat as needed for throat irritation / pain. 05/13/17   Tasia Catchings, Annessa Satre V, PA-C  Turmeric 500 MG CAPS Take by mouth.    [provider]  VICTOZA 18 MG/3ML SOPN INJECT 0.3 MLS (1.8 MG TOTAL) INTO THE SKIN DAILY. 11/24/16   Elayne Snare, MD  vitamin B-12 (CYANOCOBALAMIN) 1000 MCG tablet Take 1,000 mcg by mouth daily.    [provider]  vitamin E (VITAMIN E) 400 UNIT capsule Take 400 Units by mouth daily.    [provider]   Meds Ordered and Administered this Visit  Medications - No data to display  BP (!) 145/83 (BP Location: Left Arm)   Pulse 74   Temp 99 F (37.2 C) (Oral)   Resp 18   LMP 01/04/2014   SpO2 97%  No data found.   Physical Exam  Constitutional: She is oriented to person, place, and time. She appears well-developed and well-nourished. No distress.  HENT:  Head: Normocephalic and atraumatic.  Right Ear: Tympanic membrane, external ear and ear canal normal.  Left Ear: External ear and ear canal normal. Tympanic membrane is injected and erythematous. Tympanic membrane is not bulging.  Nose: Mucosal edema and rhinorrhea present. Right sinus exhibits no maxillary sinus tenderness and no frontal sinus tenderness. Left sinus exhibits no maxillary sinus tenderness and no frontal sinus tenderness.  Mouth/Throat: Uvula is midline and mucous membranes are normal. Posterior oropharyngeal edema and posterior oropharyngeal erythema present. Tonsils are 2+ on the right. Tonsils are 2+ on the left. Tonsillar exudate.  Eyes: Conjunctivae are normal. Pupils are equal, round, and reactive to light.  Cardiovascular: Normal rate, regular rhythm and normal heart sounds.  Exam reveals no friction rub.   No murmur heard. Pulmonary/Chest: Effort normal and breath sounds normal. She has no wheezes. She has no rales.  Lymphadenopathy:    She has cervical adenopathy.   Neurological: She is alert and oriented to person, place, and time.  Skin: Skin is warm and dry.  Psychiatric: She has a normal mood and affect. Her behavior is normal. Judgment normal.    Urgent Care Course     Procedures (including critical care time)  Labs Review Labs Reviewed  POCT RAPID STREP A    Imaging Review No results found.      MDM   1. Acute serous otitis media of left ear, recurrence not specified   2. Pharyngitis, unspecified etiology    Reviewed lab results with patient, rapid strep is negative. Culture sent and patient will be contacted with any positive results. Discussed with patient sore throat could also be due to  post nasal drip and coughing. Patient also with otitis media of the left ear. Start Amoxicillin 500mg  BID x 7 days. Start Phenol throat spray for sore throat. Side effects of medicine discussed with patient. Reviewed patient's otc medications with her, and to continue with decongestants and flonase. Patient to monitor for fever, trouble breathing, throat swelling, to go to the ED for evaluation.    Ok Edwards, PA-C 05/13/17 1206

## 2017-05-15 ENCOUNTER — Other Ambulatory Visit (INDEPENDENT_AMBULATORY_CARE_PROVIDER_SITE_OTHER): Payer: 59

## 2017-05-15 DIAGNOSIS — E1165 Type 2 diabetes mellitus with hyperglycemia: Secondary | ICD-10-CM | POA: Diagnosis not present

## 2017-05-15 LAB — LIPID PANEL
Cholesterol: 127 mg/dL (ref 0–200)
HDL: 31.3 mg/dL — ABNORMAL LOW (ref >39.00)
LDL Cholesterol: 73 mg/dL (ref 0–99)
NonHDL: 95.63
Total CHOL/HDL Ratio: 4
Triglycerides: 112 mg/dL (ref 0.0–149.0)
VLDL: 22.4 mg/dL (ref 0.0–40.0)

## 2017-05-15 LAB — BASIC METABOLIC PANEL
BUN: 14 mg/dL (ref 6–23)
CO2: 29 mEq/L (ref 19–32)
Calcium: 8.9 mg/dL (ref 8.4–10.5)
Chloride: 102 mEq/L (ref 96–112)
Creatinine, Ser: 0.85 mg/dL (ref 0.40–1.20)
GFR: 89.36 mL/min (ref >60.00)
Glucose, Bld: 94 mg/dL (ref 70–99)
Potassium: 4 mEq/L (ref 3.5–5.1)
Sodium: 137 mEq/L (ref 135–145)

## 2017-05-15 LAB — MICROALBUMIN / CREATININE URINE RATIO
Creatinine,U: 249.7 mg/dL
Microalb Creat Ratio: 2.4 mg/g (ref 0.0–30.0)
Microalb, Ur: 5.9 mg/dL — ABNORMAL HIGH (ref 0.0–1.9)

## 2017-05-15 LAB — HEMOGLOBIN A1C: Hgb A1c MFr Bld: 6.5 % (ref 4.6–6.5)

## 2017-05-16 LAB — CULTURE, GROUP A STREP (THRC)

## 2017-05-19 ENCOUNTER — Ambulatory Visit (INDEPENDENT_AMBULATORY_CARE_PROVIDER_SITE_OTHER): Payer: 59 | Admitting: Endocrinology

## 2017-05-19 ENCOUNTER — Encounter: Payer: Self-pay | Admitting: Endocrinology

## 2017-05-19 ENCOUNTER — Other Ambulatory Visit: Payer: Self-pay

## 2017-05-19 VITALS — BP 138/88 | HR 65 | Ht 67.0 in | Wt 232.4 lb

## 2017-05-19 DIAGNOSIS — E782 Mixed hyperlipidemia: Secondary | ICD-10-CM | POA: Diagnosis not present

## 2017-05-19 DIAGNOSIS — E119 Type 2 diabetes mellitus without complications: Secondary | ICD-10-CM | POA: Diagnosis not present

## 2017-05-19 MED ORDER — NIACIN ER (ANTIHYPERLIPIDEMIC) 1000 MG PO TBCR: 1000.0000 mg | EXTENDED_RELEASE_TABLET | Freq: Every day | ORAL | 3 refills | Status: DC

## 2017-05-19 MED ORDER — INSULIN PEN NEEDLE 32G X 4 MM MISC: 5 refills | Status: DC

## 2017-05-19 NOTE — Patient Instructions (Signed)
Keep walking

## 2017-05-19 NOTE — Progress Notes (Signed)
Patient ID: Alicia Murray, female   DOB: 08/15/1961, 56 y.o.   MRN: [id number]   Reason for Appointment: Diabetes follow-up   History of Present Illness   Diagnosis: Type 2 DIABETES MELITUS, date of diagnosis:  2007     Previous history: She had significant hyperglycemia diagnosis and was treated with insulin and metformin. However with efforts to lose weight she was able to be treated without medications for sometime. She currently was started on metformin but because of her progressive weight gain she was switched to Victoza with good success and has been on this since 2/14. Currently detailed records are not available  Recent history:   Non-insulin hypoglycemic drugs: Victoza 1.8 mg daily   She has relatively better A1c of 6.8, previously 6.8   Current management, blood sugar patterns and problems identified:   She has not checked her blood sugar at she has not had a functioning meter  After repeated reminders to start exercise she has now started walking up to 30-60 minutes about 5 days a week  However she has not found a group to walk for the last 2 weeks  With this her weight has come down significantly  However she is still not watching her diet and is poorly compliant with watching her portions and eating sweets and is still stress eating  She was asked to start Wellbutrin again on the last visit but even though she has a prescription she does not want to add another medication  She does work night shifts     Side effects from medications: None       Monitors blood glucose:  none recently   Glucometer: One Touch.          Blood Glucose readings : None    Meals: 2 meals per day.          Physical activity: exercise: Walking up to 60  Min until recently                  WEIGHT control:  She was told to try Wellbutrin on her last few visits because of her stress eating but she did not try it again PCP has not offered her any antidepressants She  tends to lose some weight with phentermine 37.5 mg given by her PCP but when she stopped this her weight rebound back to baseline   Wt Readings from Last 3 Encounters:  05/19/17 232 lb 6.4 oz (105.4 kg)  02/17/17 243 lb (110.2 kg)  09/17/16 236 lb (107 kg)    LABS:  Lab Results  Component Value Date   HGBA1C 6.5 05/15/2017   HGBA1C 6.8 (H) 02/13/2017   HGBA1C 6.3 09/11/2016   Lab Results  Component Value Date   MICROALBUR 5.9 (H) 05/15/2017   LDLCALC 73 05/15/2017   CREATININE 0.85 05/15/2017    Appointment on 05/15/2017  Component Date Value Ref Range Status  . Hgb A1c MFr Bld 05/15/2017 6.5  4.6 - 6.5 % Final   Glycemic Control Guidelines for People with Diabetes:Non Diabetic:  <6%Goal of Therapy: <7%Additional Action Suggested:  >8%   . Sodium 05/15/2017 137  135 - 145 mEq/L Final  . Potassium 05/15/2017 4.0  3.5 - 5.1 mEq/L Final  . Chloride 05/15/2017 102  96 - 112 mEq/L Final  . CO2 05/15/2017 29  19 - 32 mEq/L Final  . Glucose, Bld 05/15/2017 94  70 - 99 mg/dL Final  . BUN 05/15/2017 14  6 - 23 mg/dL Final  .  Creatinine, Ser 05/15/2017 0.85  0.40 - 1.20 mg/dL Final  . Calcium 05/15/2017 8.9  8.4 - 10.5 mg/dL Final  . GFR 05/15/2017 89.36  >60.00 mL/min Final  . Microalb, Ur 05/15/2017 5.9* 0.0 - 1.9 mg/dL Final  . Creatinine,U 05/15/2017 249.7  mg/dL Final  . Microalb Creat Ratio 05/15/2017 2.4  0.0 - 30.0 mg/g Final  . Cholesterol 05/15/2017 127  0 - 200 mg/dL Final   ATP III Classification       Desirable:  < 200 mg/dL               Borderline High:  200 - 239 mg/dL          High:  > = 240 mg/dL  . Triglycerides 05/15/2017 112.0  0.0 - 149.0 mg/dL Final   Normal:  <150 mg/dLBorderline High:  150 - 199 mg/dL  . HDL 05/15/2017 31.30* >39.00 mg/dL Final  . VLDL 05/15/2017 22.4  0.0 - 40.0 mg/dL Final  . LDL Cholesterol 05/15/2017 73  0 - 99 mg/dL Final  . Total CHOL/HDL Ratio 05/15/2017 4   Final                  Men          Women1/2 Average Risk     3.4           3.3Average Risk          5.0          4.42X Average Risk          9.6          7.13X Average Risk          15.0          11.0                      . NonHDL 05/15/2017 95.63   Final   NOTE:  Non-HDL goal should be 30 mg/dL higher than patient's LDL goal (i.e. LDL goal of < 70 mg/dL, would have non-HDL goal of < 100 mg/dL)  Admission on 05/13/2017, Discharged on 05/13/2017  Component Date Value Ref Range Status  . Streptococcus, Group A Screen (Dir* 05/13/2017 NEGATIVE  NEGATIVE Final  . Specimen Description 05/13/2017 THROAT   Final  . Special Requests 05/13/2017 NONE   Final  . Culture 05/13/2017 NO GROUP A STREP (S.PYOGENES) ISOLATED   Final  . Report Status 05/13/2017 05/16/2017 FINAL   Final    Allergies as of 05/19/2017   No Known Allergies     Medication List       Accurate as of 05/19/17  6:44 PM. Always use your most recent med list.          amoxicillin 500 MG capsule Commonly known as:  AMOXIL Take 1 capsule (500 mg total) by mouth 2 (two) times daily.   aspirin 325 MG tablet Take 325 mg by mouth daily.   atorvastatin 10 MG tablet Commonly known as:  LIPITOR TAKE 1 TABLET (10 MG TOTAL) BY MOUTH DAILY.   B-COMPLEX-C PO Take 1 tablet by mouth daily.   BIOTIN MAXIMUM STRENGTH 10 MG Tabs Generic drug:  Biotin Take 1,000 mg by mouth. Once daily   Evening Primrose Oil 1000 MG Caps Take by mouth.   Fish Oil 1000 MG Caps Take by mouth.   fluticasone 50 MCG/ACT nasal spray Commonly known as:  FLONASE PLACE 1 SPRAY INTO BOTH NOSTRILS DAILY.   glucose blood test strip  Commonly known as:  ONE TOUCH ULTRA TEST 1 each by Other route 2 (two) times daily. Use as instructed   Insulin Pen Needle 32G X 4 MM Misc Commonly known as:  BD PEN NEEDLE NANO U/F Use one per day with Victoza   niacin 1000 MG CR tablet Commonly known as:  NIASPAN Take 1 tablet (1,000 mg total) by mouth at bedtime.   niacin 500 MG tablet Commonly known as:  SLO-NIACIN Take 500 mg by mouth  2 (two) times daily with a meal. Patient takes the regular niacin by [facility]   nystatin-triamcinolone cream Commonly known as:  MYCOLOG II   ONE TOUCH LANCETS Misc 1 each by Does not apply route 2 (two) times daily.   phenol 1.4 % Liqd Commonly known as:  CHLORASEPTIC Use as directed 1 spray in the mouth or throat as needed for throat irritation / pain.   Turmeric 500 MG Caps Take by mouth.   VICTOZA 18 MG/3ML Sopn Generic drug:  liraglutide INJECT 0.3 MLS (1.8 MG TOTAL) INTO THE SKIN DAILY.   vitamin B-12 1000 MCG tablet Commonly known as:  CYANOCOBALAMIN Take 1,000 mcg by mouth daily.   Vitamin D3 2000 units Tabs Take 1 tablet by mouth daily.   vitamin E 400 UNIT capsule Generic drug:  vitamin E Take 400 Units by mouth daily.       Allergies: No Known Allergies  Past Medical History:  Diagnosis Date  . Bladder infection   . Diabetes mellitus   . Fibroid   . H/O varicella   . History of measles, mumps, or rubella   . Infertility   . Monilia infection 07/10/08  . Thrombosed external hemorrhoid 08/15/85  . Thyroid enlargement   . Vagina itching 07/10/08  . Vulvitis 08/01/08    Past Surgical History:  Procedure Laterality Date  . BREAST BIOPSY Left 08/08/2013  . MYOMECTOMY ABDOMINAL APPROACH  1977    Family History  Problem Relation Age of Onset  . Cancer Mother        Breast  . Breast cancer Mother   . Cancer Father        Brain  . Hypertension Brother     Social History:  reports that she has quit smoking. She has never used smokeless tobacco. She reports that she does not drink alcohol or use drugs.  Review of Systems:   Lipids:  Has had diabetic dyslipidemia with increased particle number and triglycerides, Has been on combination with statins and OTC niacin 500 mg  twice a day She is tolerating this regimen  Has been taking aspirin 30 minutes before niacin  LDL particle number previously was 1016 ,had gone up to 1144, previously below  1000 LDL is below 100  However she has not been able to find Slo-Niacin recently and her HDL is down about 20 mg  Lab Results  Component Value Date   CHOL 127 05/15/2017   HDL 31.30 (L) 05/15/2017   LDLCALC 73 05/15/2017   TRIG 112.0 05/15/2017   CHOLHDL 4 05/15/2017     Last diabetic foot exam was done in 11/17, normal    Examination:   BP 138/88   Pulse 65   Ht 5\' 7"  (1.702 m)   Wt 232 lb 6.4 oz (105.4 kg)   LMP 01/04/2014   SpO2 95%   BMI 36.40 kg/m   Body mass index is 36.4 kg/m.     ASSESSMENT/ PLAN:   Diabetes type 2   See history of  present illness for detailed discussion of current diabetes management, blood sugar patterns and problems identified  With doing exercise since her last visit her weight is down and her A1c is slightly better at 6.5 Not checking blood sugars at home although lab glucose is below 100   She has poor motivation to watch her diet   Recommendations: She will start walking at the gym She will try Wellbutrin at least for 1 month, reassured her that the chances of getting hallucinations as side effect are extremely rare and this will stop her stress eating hopefully  HYPERLIPIDEMIA:   She will be prescribed Generic Niaspan Lipids will be rechecked on the next visit  Patient Instructions  Keep walking    [facility] 05/19/2017, 6:44 PM

## 2017-06-08 ENCOUNTER — Other Ambulatory Visit: Payer: Self-pay | Admitting: Endocrinology

## 2017-08-17 ENCOUNTER — Other Ambulatory Visit: Payer: Self-pay

## 2017-08-26 ENCOUNTER — Other Ambulatory Visit: Payer: Self-pay

## 2017-09-01 ENCOUNTER — Other Ambulatory Visit: Payer: Self-pay

## 2017-09-01 ENCOUNTER — Telehealth: Payer: Self-pay | Admitting: Endocrinology

## 2017-09-01 MED ORDER — BUPROPION HCL ER (XL) 150 MG PO TB24: 150.0000 mg | ORAL_TABLET | Freq: Every day | ORAL | 0 refills | Status: DC

## 2017-09-01 NOTE — Telephone Encounter (Signed)
Bupropion ER 150 mg once a day

## 2017-09-01 NOTE — Telephone Encounter (Signed)
I do not see this prescription on the medication list for patient.  Please advise if okay to fill and give strength and dosage.

## 2017-09-01 NOTE — Telephone Encounter (Signed)
I have sent this prescription for 90 days to the [facility] listed on pharmacy list.

## 2017-09-01 NOTE — Telephone Encounter (Signed)
[facility] needs [facility] day supply on the bupropreon rx please so insurance will cover please

## 2017-09-02 ENCOUNTER — Other Ambulatory Visit: Payer: Self-pay

## 2017-09-02 MED ORDER — BUPROPION HCL ER (XL) 150 MG PO TB24: 150.0000 mg | ORAL_TABLET | Freq: Every day | ORAL | 2 refills | Status: DC

## 2017-09-03 ENCOUNTER — Other Ambulatory Visit: Payer: Self-pay

## 2017-09-04 ENCOUNTER — Other Ambulatory Visit: Payer: Self-pay | Admitting: Endocrinology

## 2017-09-15 ENCOUNTER — Other Ambulatory Visit (INDEPENDENT_AMBULATORY_CARE_PROVIDER_SITE_OTHER): Payer: 59

## 2017-09-15 DIAGNOSIS — E119 Type 2 diabetes mellitus without complications: Secondary | ICD-10-CM | POA: Diagnosis not present

## 2017-09-15 DIAGNOSIS — E782 Mixed hyperlipidemia: Secondary | ICD-10-CM | POA: Diagnosis not present

## 2017-09-15 LAB — COMPREHENSIVE METABOLIC PANEL
ALT: 22 U/L (ref 0–35)
AST: 22 U/L (ref 0–37)
Albumin: 3.9 g/dL (ref 3.5–5.2)
Alkaline Phosphatase: 41 U/L (ref 39–117)
BUN: 12 mg/dL (ref 6–23)
CO2: 30 mEq/L (ref 19–32)
Calcium: 8.5 mg/dL (ref 8.4–10.5)
Chloride: 107 mEq/L (ref 96–112)
Creatinine, Ser: 0.82 mg/dL (ref 0.40–1.20)
GFR: 93.02 mL/min (ref >60.00)
Glucose, Bld: 144 mg/dL — ABNORMAL HIGH (ref 70–99)
Potassium: 3.7 mEq/L (ref 3.5–5.1)
Sodium: 141 mEq/L (ref 135–145)
Total Bilirubin: 0.4 mg/dL (ref 0.2–1.2)
Total Protein: 7 g/dL (ref 6.0–8.3)

## 2017-09-15 LAB — LIPID PANEL
Cholesterol: 145 mg/dL (ref 0–200)
HDL: 36 mg/dL — ABNORMAL LOW (ref >39.00)
LDL Cholesterol: 87 mg/dL (ref 0–99)
NonHDL: 109.22
Total CHOL/HDL Ratio: 4
Triglycerides: 110 mg/dL (ref 0.0–149.0)
VLDL: 22 mg/dL (ref 0.0–40.0)

## 2017-09-15 LAB — MICROALBUMIN / CREATININE URINE RATIO
Creatinine,U: 222 mg/dL
Microalb Creat Ratio: 2.3 mg/g (ref 0.0–30.0)
Microalb, Ur: 5.1 mg/dL — ABNORMAL HIGH (ref 0.0–1.9)

## 2017-09-15 LAB — HEMOGLOBIN A1C: Hgb A1c MFr Bld: 6.5 % (ref 4.6–6.5)

## 2017-09-18 ENCOUNTER — Encounter: Payer: Self-pay | Admitting: Endocrinology

## 2017-09-18 ENCOUNTER — Ambulatory Visit (INDEPENDENT_AMBULATORY_CARE_PROVIDER_SITE_OTHER): Payer: 59 | Admitting: Endocrinology

## 2017-09-18 VITALS — BP 112/82 | HR 75 | Ht 67.0 in | Wt 234.4 lb

## 2017-09-18 DIAGNOSIS — E119 Type 2 diabetes mellitus without complications: Secondary | ICD-10-CM

## 2017-09-18 DIAGNOSIS — E049 Nontoxic goiter, unspecified: Secondary | ICD-10-CM

## 2017-09-18 DIAGNOSIS — E782 Mixed hyperlipidemia: Secondary | ICD-10-CM

## 2017-09-18 NOTE — Progress Notes (Signed)
Patient ID: Jamillah Camilo, female   DOB: 1961-05-01, 56 y.o.   MRN: [id number]   Reason for Appointment: Diabetes follow-up   History of Present Illness   Diagnosis: Type 2 DIABETES MELITUS, date of diagnosis:  2007     Previous history: She had significant hyperglycemia diagnosis and was treated with insulin and metformin. However with efforts to lose weight she was able to be treated without medications for sometime. She currently was started on metformin but because of her progressive weight gain she was switched to Victoza with good success and has been on this since 2/14. Currently detailed records are not available  Recent history:   Non-insulin hypoglycemic drugs: Victoza 1.8 mg daily  She has a stable A1c of 6.5 again  Current management, blood sugar patterns and problems identified:   She has not checked her blood sugar recently again  Although she was starting to do some exercise on her last visit she has not been motivated to do any recently  She had started losing weight on the last visit but is up 2 pounds now  However she has not found a group to walk for the last 2 weeks  With this her weight has come down significantly  However she is still not watching her diet and is poorly compliant with watching her portions and eating sweets and is still stress eating  She was asked to start Wellbutrin on her last visit but she does not think this  helps her control her choice of foods and portions  Again admits that she cannot control her craving for carbohydrates  She does work night shifts as before     Side effects from medications: None       Monitors blood glucose:  none recently   Glucometer: One Touch.          Blood Glucose readings : None Lab glucose 144, not fasting    Meals: 2 meals per day.          Physical activity: exercise: none             WEIGHT control:  She was told to try Wellbutrin on her last few visits because of her  stress eating Although she has tried to 150 g daily at least for the last month she has not found any benefit from Her insurance does not cover weight loss medication  She tends to lose some weight with phentermine 37.5 mg given by her PCP but when she stopped this her weight rebound back to baseline   Wt Readings from Last 3 Encounters:  09/18/17 234 lb 6 oz (106.3 kg)  05/19/17 232 lb 6.4 oz (105.4 kg)  02/17/17 243 lb (110.2 kg)    LABS:  Lab Results  Component Value Date   HGBA1C 6.5 09/15/2017   HGBA1C 6.5 05/15/2017   HGBA1C 6.8 (H) 02/13/2017   Lab Results  Component Value Date   MICROALBUR 5.1 (H) 09/15/2017   LDLCALC 87 09/15/2017   CREATININE 0.82 09/15/2017    Lab on 09/15/2017  Component Date Value Ref Range Status  . Cholesterol 09/15/2017 145  0 - 200 mg/dL Final   ATP III Classification       Desirable:  < 200 mg/dL               Borderline High:  200 - 239 mg/dL          High:  > = 240 mg/dL  . Triglycerides 09/15/2017 110.0  0.0 - 149.0 mg/dL Final   Normal:  <150 mg/dLBorderline High:  150 - 199 mg/dL  . HDL 09/15/2017 36.00* >39.00 mg/dL Final  . VLDL 09/15/2017 22.0  0.0 - 40.0 mg/dL Final  . LDL Cholesterol 09/15/2017 87  0 - 99 mg/dL Final  . Total CHOL/HDL Ratio 09/15/2017 4   Final                  Men          Women1/2 Average Risk     3.4          3.3Average Risk          5.0          4.42X Average Risk          9.6          7.13X Average Risk          15.0          11.0                      . NonHDL 09/15/2017 109.22   Final   NOTE:  Non-HDL goal should be 30 mg/dL higher than patient's LDL goal (i.e. LDL goal of < 70 mg/dL, would have non-HDL goal of < 100 mg/dL)  . Microalb, Ur 09/15/2017 5.1* 0.0 - 1.9 mg/dL Final  . Creatinine,U 09/15/2017 222.0  mg/dL Final  . Microalb Creat Ratio 09/15/2017 2.3  0.0 - 30.0 mg/g Final  . Sodium 09/15/2017 141  135 - 145 mEq/L Final  . Potassium 09/15/2017 3.7  3.5 - 5.1 mEq/L Final  . Chloride 09/15/2017  107  96 - 112 mEq/L Final  . CO2 09/15/2017 30  19 - 32 mEq/L Final  . Glucose, Bld 09/15/2017 144* 70 - 99 mg/dL Final  . BUN 09/15/2017 12  6 - 23 mg/dL Final  . Creatinine, Ser 09/15/2017 0.82  0.40 - 1.20 mg/dL Final  . Total Bilirubin 09/15/2017 0.4  0.2 - 1.2 mg/dL Final  . Alkaline Phosphatase 09/15/2017 41  39 - 117 U/L Final  . AST 09/15/2017 22  0 - 37 U/L Final  . ALT 09/15/2017 22  0 - 35 U/L Final  . Total Protein 09/15/2017 7.0  6.0 - 8.3 g/dL Final  . Albumin 09/15/2017 3.9  3.5 - 5.2 g/dL Final  . Calcium 09/15/2017 8.5  8.4 - 10.5 mg/dL Final  . GFR 09/15/2017 93.02  >60.00 mL/min Final  . Hgb A1c MFr Bld 09/15/2017 6.5  4.6 - 6.5 % Final   Glycemic Control Guidelines for People with Diabetes:Non Diabetic:  <6%Goal of Therapy: <7%Additional Action Suggested:  >8%     Allergies as of 09/18/2017   No Known Allergies     Medication List        Accurate as of 09/18/17 11:09 AM. Always use your most recent med list.          APPLE CIDER VINEGAR PO Take by mouth.   aspirin 325 MG tablet Take 325 mg by mouth daily.   atorvastatin 10 MG tablet Commonly known as:  LIPITOR TAKE 1 TABLET BY MOUTH EVERY DAY   B-COMPLEX-C PO Take 1 tablet by mouth daily.   BIOTIN MAXIMUM STRENGTH 10 MG Tabs Generic drug:  Biotin Take 1,000 mg by mouth. Once daily   buPROPion 150 MG 24 hr tablet Commonly known as:  WELLBUTRIN XL Take 1 tablet (150 mg total) by mouth daily.   Evening Primrose  Oil 1000 MG Caps Take by mouth.   Fish Oil 1000 MG Caps Take by mouth.   fluticasone 50 MCG/ACT nasal spray Commonly known as:  FLONASE PLACE 1 SPRAY INTO BOTH NOSTRILS DAILY.   glucose blood test strip Commonly known as:  ONE TOUCH ULTRA TEST 1 each by Other route 2 (two) times daily. Use as instructed   Insulin Pen Needle 32G X 4 MM Misc Commonly known as:  BD PEN NEEDLE NANO U/F Use one per day with Victoza   niacin 1000 MG CR tablet Commonly known as:  NIASPAN Take 1  tablet (1,000 mg total) by mouth at bedtime.   niacin 500 MG tablet Commonly known as:  SLO-NIACIN Take 500 mg by mouth 2 (two) times daily with a meal. Patient takes the regular niacin by [facility]   nystatin-triamcinolone cream Commonly known as:  MYCOLOG II   ONE TOUCH LANCETS Misc 1 each by Does not apply route 2 (two) times daily.   phenol 1.4 % Liqd Commonly known as:  CHLORASEPTIC Use as directed 1 spray in the mouth or throat as needed for throat irritation / pain.   Turmeric 500 MG Caps Take by mouth.   VICTOZA 18 MG/3ML Sopn Generic drug:  liraglutide INJECT 0.3 MLS (1.8 MG TOTAL) INTO THE SKIN DAILY.   vitamin B-12 1000 MCG tablet Commonly known as:  CYANOCOBALAMIN Take 1,000 mcg by mouth daily.   Vitamin D3 2000 units Tabs Take 1 tablet by mouth daily.   vitamin E 400 UNIT capsule Generic drug:  vitamin E Take 400 Units by mouth daily.       Allergies: No Known Allergies  Past Medical History:  Diagnosis Date  . Bladder infection   . Diabetes mellitus   . Fibroid   . H/O varicella   . History of measles, mumps, or rubella   . Infertility   . Monilia infection 07/10/08  . Thrombosed external hemorrhoid 08/15/85  . Thyroid enlargement   . Vagina itching 07/10/08  . Vulvitis 08/01/08    Past Surgical History:  Procedure Laterality Date  . BREAST BIOPSY Left 08/08/2013  . MYOMECTOMY ABDOMINAL APPROACH  1977    Family History  Problem Relation Age of Onset  . Cancer Mother        Breast  . Breast cancer Mother   . Cancer Father        Brain  . Hypertension Brother     Social History:  reports that she has quit smoking. she has never used smokeless tobacco. She reports that she does not drink alcohol or use drugs.  Review of Systems:   Lipids:  Has had diabetic dyslipidemia with increased particle number and triglycerides, Has been on combination with statins Also not taking generic Niaspan since she was not able to get Slo-Niacin or  history and her HDL had gone down significantly She is tolerating this regimen  Has been taking aspirin 30 minutes before niacin  LDL particle number previously was 1016 ,had gone up to 1144, previously below 1000 LDL is below 100, HDL up 5 mg     Lab Results  Component Value Date   CHOL 145 09/15/2017   HDL 36.00 (L) 09/15/2017   LDLCALC 87 09/15/2017   TRIG 110.0 09/15/2017   CHOLHDL 4 09/15/2017     Last diabetic foot exam was done in 11/17, normal    Examination:   BP 112/82   Pulse 75   Ht 5\' 7"  (1.702 m)   Wt  234 lb 6 oz (106.3 kg)   LMP 01/04/2014   SpO2 96%   BMI 36.71 kg/m   Body mass index is 36.71 kg/m.     ASSESSMENT/ PLAN:   Diabetes type 2   See history of present illness for detailed discussion of current diabetes management, blood sugar patterns and problems identified  Currently being managed with Victoza alone  Despite her not exercising or watching her diet is consistently her A1c unchanged at 6.5 Also has not checked her blood sugars at home and encouraged her to do some readings after meals at least  She has poor motivation to watch her diet or exercise recently  Recommendations: She will start walking or go to the gym, she can try to do this with her husband  She will try 2 tablets of Wellbutrin at least until the end of the month and if she finds this beneficial she will call for a new prescription This may help of motivation level, compliance with diet and efforts to lose weight  HYPERLIPIDEMIA:   She will continue Generic Niaspan along with Lipitor Her HDL is improving but can probably improve further with continued treatment and weight loss  Lipids will be rechecked on the next visit  Patient Instructions  Try 2 Welbutrin   Exercise daily    Maelyn Berrey 09/18/2017, 11:09 AM

## 2017-09-18 NOTE — Patient Instructions (Signed)
Try 2 Welbutrin   Exercise daily

## 2017-09-20 ENCOUNTER — Encounter: Payer: Self-pay | Admitting: Endocrinology

## 2017-11-23 ENCOUNTER — Other Ambulatory Visit: Payer: Self-pay | Admitting: Endocrinology

## 2018-01-05 ENCOUNTER — Other Ambulatory Visit: Payer: Self-pay | Admitting: Endocrinology

## 2018-01-12 ENCOUNTER — Other Ambulatory Visit (INDEPENDENT_AMBULATORY_CARE_PROVIDER_SITE_OTHER): Payer: 59

## 2018-01-12 DIAGNOSIS — E049 Nontoxic goiter, unspecified: Secondary | ICD-10-CM | POA: Diagnosis not present

## 2018-01-12 DIAGNOSIS — E782 Mixed hyperlipidemia: Secondary | ICD-10-CM

## 2018-01-12 DIAGNOSIS — E119 Type 2 diabetes mellitus without complications: Secondary | ICD-10-CM

## 2018-01-12 LAB — LIPID PANEL
Cholesterol: 128 mg/dL (ref 0–200)
HDL: 37.9 mg/dL — ABNORMAL LOW (ref >39.00)
LDL Cholesterol: 80 mg/dL (ref 0–99)
NonHDL: 90.52
Total CHOL/HDL Ratio: 3
Triglycerides: 54 mg/dL (ref 0.0–149.0)
VLDL: 10.8 mg/dL (ref 0.0–40.0)

## 2018-01-12 LAB — COMPREHENSIVE METABOLIC PANEL
ALT: 21 U/L (ref 0–35)
AST: 22 U/L (ref 0–37)
Albumin: 4 g/dL (ref 3.5–5.2)
Alkaline Phosphatase: 40 U/L (ref 39–117)
BUN: 11 mg/dL (ref 6–23)
CO2: 29 mEq/L (ref 19–32)
Calcium: 9.1 mg/dL (ref 8.4–10.5)
Chloride: 105 mEq/L (ref 96–112)
Creatinine, Ser: 0.78 mg/dL (ref 0.40–1.20)
GFR: 98.43 mL/min (ref >60.00)
Glucose, Bld: 105 mg/dL — ABNORMAL HIGH (ref 70–99)
Potassium: 4.1 mEq/L (ref 3.5–5.1)
Sodium: 138 mEq/L (ref 135–145)
Total Bilirubin: 0.3 mg/dL (ref 0.2–1.2)
Total Protein: 7.1 g/dL (ref 6.0–8.3)

## 2018-01-12 LAB — HEMOGLOBIN A1C: Hgb A1c MFr Bld: 6.4 % (ref 4.6–6.5)

## 2018-01-12 LAB — TSH: TSH: 0.93 u[IU]/mL (ref 0.35–4.50)

## 2018-01-14 ENCOUNTER — Ambulatory Visit (INDEPENDENT_AMBULATORY_CARE_PROVIDER_SITE_OTHER): Payer: 59 | Admitting: Endocrinology

## 2018-01-14 ENCOUNTER — Encounter: Payer: Self-pay | Admitting: Endocrinology

## 2018-01-14 DIAGNOSIS — E119 Type 2 diabetes mellitus without complications: Secondary | ICD-10-CM

## 2018-01-14 NOTE — Patient Instructions (Addendum)
Otc lamisil for toes  Exercise on days off

## 2018-01-14 NOTE — Progress Notes (Signed)
Patient ID: Anett Ranker, female   DOB: [birth year], 57 y.o.   MRN: [id number]   Reason for Appointment: Diabetes follow-up   History of Present Illness   Diagnosis: Type 2 DIABETES MELITUS, date of diagnosis:  2007     Previous history: She had significant hyperglycemia diagnosis and was treated with insulin and metformin. However with efforts to lose weight she was able to be treated without medications for sometime. She currently was started on metformin but because of her progressive weight gain she was switched to Victoza with good success and has been on this since 2/14. Currently detailed records are not available  Recent history:   Non-insulin hypoglycemic drugs: Victoza 1.8 mg daily  She has a stable A1c of 6.4  Current management, blood sugar patterns and problems identified:   She has not started any exercise despite repeated reminders and encouragement on each visit  She now says that she is to start using a blood sugar meter provided by her insurance but has not brought this for download and does not know what her readings are  She cannot control her appetite and cravings and her diet is still poor  She was tried on Wellbutrin on the last visit and was told to try a higher dose but she stopped taking this about a month ago since she did not think it was benefiting  Surprisingly she has lost 6 pounds and she thinks this may be from taking apple cider vinegar capsules  She does work night shifts as before     Side effects from medications: None       Monitors blood glucose:  none  Glucometer: One Touch.          Blood Glucose readings : None Lab glucose 105 fasting    Meals: 2 meals per day.          Physical activity: exercise: none             WEIGHT control:  Currently not being managed with any diet or exercise regimen Did not seem to benefit from bupropion 300 mg  Her insurance does not cover weight loss medication   Wt Readings from  Last 3 Encounters:  01/14/18 228 lb 9.6 oz (103.7 kg)  09/18/17 234 lb 6 oz (106.3 kg)  05/19/17 232 lb 6.4 oz (105.4 kg)    LABS:  Lab Results  Component Value Date   HGBA1C 6.4 01/12/2018   HGBA1C 6.5 09/15/2017   HGBA1C 6.5 05/15/2017   Lab Results  Component Value Date   MICROALBUR 5.1 (H) 09/15/2017   LDLCALC 80 01/12/2018   CREATININE 0.78 01/12/2018    Lab on 01/12/2018  Component Date Value Ref Range Status  . TSH 01/12/2018 0.93  0.35 - 4.50 uIU/mL Final  . Cholesterol 01/12/2018 128  0 - 200 mg/dL Final   ATP III Classification       Desirable:  < 200 mg/dL               Borderline High:  200 - 239 mg/dL          High:  > = 240 mg/dL  . Triglycerides 01/12/2018 54.0  0.0 - 149.0 mg/dL Final   Normal:  <150 mg/dLBorderline High:  150 - 199 mg/dL  . HDL 01/12/2018 37.90* >39.00 mg/dL Final  . VLDL 01/12/2018 10.8  0.0 - 40.0 mg/dL Final  . LDL Cholesterol 01/12/2018 80  0 - 99 mg/dL Final  . Total CHOL/HDL Ratio 01/12/2018  3   Final                  Men          Women1/2 Average Risk     3.4          3.3Average Risk          5.0          4.42X Average Risk          9.6          7.13X Average Risk          15.0          11.0                      . NonHDL 01/12/2018 90.52   Final   NOTE:  Non-HDL goal should be 30 mg/dL higher than patient's LDL goal (i.e. LDL goal of < 70 mg/dL, would have non-HDL goal of < 100 mg/dL)  . Hgb A1c MFr Bld 01/12/2018 6.4  4.6 - 6.5 % Final   Glycemic Control Guidelines for People with Diabetes:Non Diabetic:  <6%Goal of Therapy: <7%Additional Action Suggested:  >8%   . Sodium 01/12/2018 138  135 - 145 mEq/L Final  . Potassium 01/12/2018 4.1  3.5 - 5.1 mEq/L Final  . Chloride 01/12/2018 105  96 - 112 mEq/L Final  . CO2 01/12/2018 29  19 - 32 mEq/L Final  . Glucose, Bld 01/12/2018 105* 70 - 99 mg/dL Final  . BUN 01/12/2018 11  6 - 23 mg/dL Final  . Creatinine, Ser 01/12/2018 0.78  0.40 - 1.20 mg/dL Final  . Total Bilirubin 01/12/2018 0.3   0.2 - 1.2 mg/dL Final  . Alkaline Phosphatase 01/12/2018 40  39 - 117 U/L Final  . AST 01/12/2018 22  0 - 37 U/L Final  . ALT 01/12/2018 21  0 - 35 U/L Final  . Total Protein 01/12/2018 7.1  6.0 - 8.3 g/dL Final  . Albumin 01/12/2018 4.0  3.5 - 5.2 g/dL Final  . Calcium 01/12/2018 9.1  8.4 - 10.5 mg/dL Final  . GFR 01/12/2018 98.43  >60.00 mL/min Final    Allergies as of 01/14/2018   No Known Allergies     Medication List        Accurate as of 01/14/18  2:01 PM. Always use your most recent med list.          APPLE CIDER VINEGAR PO Take by mouth.   aspirin 325 MG tablet Take 325 mg by mouth daily.   atorvastatin 10 MG tablet Commonly known as:  LIPITOR TAKE 1 TABLET BY MOUTH EVERY DAY   B-COMPLEX-C PO Take 1 tablet by mouth daily.   BIOTIN MAXIMUM STRENGTH 10 MG Tabs Generic drug:  Biotin Take 1,000 mg by mouth. Once daily   Evening Primrose Oil 1000 MG Caps Take by mouth.   Fish Oil 1000 MG Caps Take by mouth.   fluticasone 50 MCG/ACT nasal spray Commonly known as:  FLONASE PLACE 1 SPRAY INTO BOTH NOSTRILS DAILY.   glucose blood test strip Commonly known as:  ONE TOUCH ULTRA TEST 1 each by Other route 2 (two) times daily. Use as instructed   Insulin Pen Needle 32G X 4 MM Misc Commonly known as:  BD PEN NEEDLE NANO U/F Use one per day with Victoza   niacin 1000 MG CR tablet Commonly known as:  NIASPAN Take 1 tablet (1,000 mg total) by mouth at  bedtime.   nystatin-triamcinolone cream Commonly known as:  MYCOLOG II   ONE TOUCH LANCETS Misc 1 each by Does not apply route 2 (two) times daily.   phenol 1.4 % Liqd Commonly known as:  CHLORASEPTIC Use as directed 1 spray in the mouth or throat as needed for throat irritation / pain.   Turmeric 500 MG Caps Take by mouth.   VICTOZA 18 MG/3ML Sopn Generic drug:  liraglutide INJECT 0.3 MLS (1.8 MG TOTAL) INTO THE SKIN DAILY.   vitamin B-12 1000 MCG tablet Commonly known as:  CYANOCOBALAMIN Take 1,000  mcg by mouth daily.   Vitamin D3 2000 units Tabs Take 1 tablet by mouth daily.   vitamin E 400 UNIT capsule Generic drug:  vitamin E Take 400 Units by mouth daily.       Allergies: No Known Allergies  Past Medical History:  Diagnosis Date  . Bladder infection   . Diabetes mellitus   . Fibroid   . H/O varicella   . History of measles, mumps, or rubella   . Infertility   . Monilia infection 07/10/08  . Thrombosed external hemorrhoid 08/15/85  . Thyroid enlargement   . Vagina itching 07/10/08  . Vulvitis 08/01/08    Past Surgical History:  Procedure Laterality Date  . BREAST BIOPSY Left 08/08/2013  . MYOMECTOMY ABDOMINAL APPROACH  1977    Family History  Problem Relation Age of Onset  . Cancer Mother        Breast  . Breast cancer Mother   . Cancer Father        Brain  . Hypertension Brother     Social History:  reports that she has quit smoking. she has never used smokeless tobacco. She reports that she does not drink alcohol or use drugs.  Review of Systems:   Lipids:  Has had diabetic dyslipidemia with increased particle number and triglycerides, Has been on combination with statins Also not taking generic Niaspan since she was not able to get Slo-Niacin or history and her HDL had gone down significantly She is tolerating this regimen  Has been taking aspirin 30 minutes before niacin  LDL particle number previously was 1016 ,had gone up to 1144, previously below 1000 LDL is below 100, HDL up 5 mg    Lab Results  Component Value Date   CHOL 128 01/12/2018   HDL 37.90 (L) 01/12/2018   LDLCALC 80 01/12/2018   TRIG 54.0 01/12/2018   CHOLHDL 3 01/12/2018     Last diabetic foot exam was done in 3/19, normal    Examination:   BP 120/70 (BP Location: Right Arm, Patient Position: Sitting, Cuff Size: Large)   Pulse 72   Wt 228 lb 9.6 oz (103.7 kg)   LMP 01/04/2014   SpO2 96%   BMI 35.80 kg/m   Body mass index is 35.8 kg/m.     ASSESSMENT/ PLAN:     Diabetes type 2 with obesity  See history of present illness for detailed discussion of current diabetes management, blood sugar patterns and problems identified  She has a mild diabetes, well controlled with the dose although This is despite a poor regimen of diet and lack of exercise She is not motivated to change her lifestyle at all  Surprisingly has lost weight and not clear what this is from Again discussed the benefits of exercise and she can try to at least start doing this on her days off   HYPERLIPIDEMIA:   Well controlled and probably benefiting  from weight loss with slightly better LDL and HDL levels  She will continue Generic Niaspan and Lipitor   There are no Patient Instructions on file for this visit.   Elayne Snare 01/14/2018, 2:01 PM

## 2018-01-15 ENCOUNTER — Ambulatory Visit: Payer: Self-pay | Admitting: Endocrinology

## 2018-04-13 ENCOUNTER — Other Ambulatory Visit: Payer: Self-pay | Admitting: Family

## 2018-04-13 DIAGNOSIS — Z1231 Encounter for screening mammogram for malignant neoplasm of breast: Secondary | ICD-10-CM

## 2018-05-03 ENCOUNTER — Ambulatory Visit
Admission: RE | Admit: 2018-05-03 | Discharge: 2018-05-03 | Disposition: A | Discharge status: Home / Self Care | Payer: 59 | Source: Ambulatory Visit | Attending: Family | Admitting: Family

## 2018-05-03 DIAGNOSIS — Z1231 Encounter for screening mammogram for malignant neoplasm of breast: Secondary | ICD-10-CM

## 2018-05-24 ENCOUNTER — Other Ambulatory Visit (INDEPENDENT_AMBULATORY_CARE_PROVIDER_SITE_OTHER): Payer: 59

## 2018-05-24 DIAGNOSIS — E119 Type 2 diabetes mellitus without complications: Secondary | ICD-10-CM | POA: Diagnosis not present

## 2018-05-24 LAB — HEMOGLOBIN A1C: Hgb A1c MFr Bld: 6.6 % — ABNORMAL HIGH (ref 4.6–6.5)

## 2018-05-24 LAB — GLUCOSE, RANDOM: Glucose, Bld: 90 mg/dL (ref 70–99)

## 2018-05-27 ENCOUNTER — Encounter: Payer: Self-pay | Admitting: Endocrinology

## 2018-05-27 ENCOUNTER — Ambulatory Visit (INDEPENDENT_AMBULATORY_CARE_PROVIDER_SITE_OTHER): Payer: 59 | Admitting: Endocrinology

## 2018-05-27 VITALS — BP 138/82 | HR 68 | Ht 67.0 in | Wt 233.6 lb

## 2018-05-27 DIAGNOSIS — E782 Mixed hyperlipidemia: Secondary | ICD-10-CM | POA: Diagnosis not present

## 2018-05-27 DIAGNOSIS — E119 Type 2 diabetes mellitus without complications: Secondary | ICD-10-CM

## 2018-05-27 NOTE — Progress Notes (Signed)
Patient ID: Alicia Murray, female   DOB: 1961-04-05, 57 y.o.   MRN: [id number]   Reason for Appointment: Diabetes follow-up   History of Present Illness   Diagnosis: Type 2 DIABETES MELITUS, date of diagnosis:  2007     Previous history: She had significant hyperglycemia diagnosis and was treated with insulin and metformin. However with efforts to lose weight she was able to be treated without medications for sometime. She currently was started on metformin but because of her progressive weight gain she was switched to Victoza with good success and has been on this since 2/14. Currently detailed records are not available  Recent history:   Non-insulin hypoglycemic drugs: Victoza 1.8 mg daily  She has a slightly higher A1c of 6.6, previously 6.4  Current management, blood sugar patterns and problems identified:   She has started doing a little walking but only for the last 3 weeks, up to 1 hour 3 times a week when she can find it worked to walk with  However her weight has gone up 5 pounds since her last visit in March  Currently she is not motivated to watch her diet  However she has started checking blood sugars with the meter provided by her insurance company which is difficult to analyze  She cannot control her appetite and cravings and her diet is consistently poor  He does blood sugar at random times and blood sugar readings have been 102-134, usually not after eating, some on waking up and some after coming back from work in the morning; however her lab glucose was lower than what she has had with her home meter  She does work night shifts as before     Side effects from medications: None       Monitors blood glucose:  none  Glucometer: One Touch.          Blood Glucose readings : As above, average over the last 30 days 117 with a couple of readings over 130 Lab glucose 90 fasting    Meals: 2 meals per day.          Physical activity: exercise: walks 3/7  days             WEIGHT control:  Currently not being managed with any diet or exercise regimen Did not seem to benefit from bupropion 300 mg  Her insurance does not cover weight loss medication   Wt Readings from Last 3 Encounters:  05/27/18 233 lb 9.6 oz (106 kg)  01/14/18 228 lb 9.6 oz (103.7 kg)  09/18/17 234 lb 6 oz (106.3 kg)    LABS:  Lab Results  Component Value Date   HGBA1C 6.6 (H) 05/24/2018   HGBA1C 6.4 01/12/2018   HGBA1C 6.5 09/15/2017   Lab Results  Component Value Date   MICROALBUR 5.1 (H) 09/15/2017   LDLCALC 80 01/12/2018   CREATININE 0.78 01/12/2018    Lab on 05/24/2018  Component Date Value Ref Range Status  . Glucose, Bld 05/24/2018 90  70 - 99 mg/dL Final  . Hgb A1c MFr Bld 05/24/2018 6.6* 4.6 - 6.5 % Final   Glycemic Control Guidelines for People with Diabetes:Non Diabetic:  <6%Goal of Therapy: <7%Additional Action Suggested:  >8%     Allergies as of 05/27/2018   No Known Allergies     Medication List        Accurate as of 05/27/18  3:41 PM. Always use your most recent med list.  APPLE CIDER VINEGAR PO Take by mouth.   aspirin 325 MG tablet Take 325 mg by mouth daily.   atorvastatin 10 MG tablet Commonly known as:  LIPITOR TAKE 1 TABLET BY MOUTH EVERY DAY   B-COMPLEX-C PO Take 1 tablet by mouth daily.   BIOTIN MAXIMUM STRENGTH 10 MG Tabs Generic drug:  Biotin Take 1,000 mg by mouth. Once daily   Evening Primrose Oil 1000 MG Caps Take by mouth.   Fish Oil 1000 MG Caps Take by mouth.   fluticasone 50 MCG/ACT nasal spray Commonly known as:  FLONASE PLACE 1 SPRAY INTO BOTH NOSTRILS DAILY.   glucose blood test strip Commonly known as:  ONE TOUCH ULTRA TEST 1 each by Other route 2 (two) times daily. Use as instructed   Insulin Pen Needle 32G X 4 MM Misc Commonly known as:  BD PEN NEEDLE NANO U/F Use one per day with Victoza   niacin 1000 MG CR tablet Commonly known as:  NIASPAN Take 1 tablet (1,000 mg  total) by mouth at bedtime.   nystatin-triamcinolone cream Commonly known as:  MYCOLOG II   ONE TOUCH LANCETS Misc 1 each by Does not apply route 2 (two) times daily.   phenol 1.4 % Liqd Commonly known as:  CHLORASEPTIC Use as directed 1 spray in the mouth or throat as needed for throat irritation / pain.   Turmeric 500 MG Caps Take by mouth.   VICTOZA 18 MG/3ML Sopn Generic drug:  liraglutide INJECT 0.3 MLS (1.8 MG TOTAL) INTO THE SKIN DAILY.   vitamin B-12 1000 MCG tablet Commonly known as:  CYANOCOBALAMIN Take 1,000 mcg by mouth daily.   Vitamin D3 2000 units Tabs Take 1 tablet by mouth daily.   vitamin E 400 UNIT capsule Generic drug:  vitamin E Take 400 Units by mouth daily.       Allergies: No Known Allergies  Past Medical History:  Diagnosis Date  . Bladder infection   . Diabetes mellitus   . Fibroid   . H/O varicella   . History of measles, mumps, or rubella   . Infertility   . Monilia infection 07/10/08  . Thrombosed external hemorrhoid 08/15/85  . Thyroid enlargement   . Vagina itching 07/10/08  . Vulvitis 08/01/08    Past Surgical History:  Procedure Laterality Date  . BREAST BIOPSY Left 08/08/2013  . MYOMECTOMY ABDOMINAL APPROACH  1977    Family History  Problem Relation Age of Onset  . Cancer Mother        Breast  . Breast cancer Mother   . Cancer Father        Brain  . Diabetes Father   . Hypertension Brother     Social History:  reports that she has quit smoking. She has never used smokeless tobacco. She reports that she does not drink alcohol or use drugs.  Review of Systems:   Lipids:  Has had diabetic dyslipidemia with increased particle number and triglycerides, Has been on combination with statins Also not taking generic Niaspan since she was not able to get Slo-Niacin or history and her HDL had gone down significantly  Has been taking aspirin 30 minutes before niacin  LDL particle number previously was 1016 ,had gone up to  1144, previously below 1000 LDL is below 100, HDL still low normal  Lab Results  Component Value Date   CHOL 128 01/12/2018   HDL 37.90 (L) 01/12/2018   LDLCALC 80 01/12/2018   TRIG 54.0 01/12/2018   CHOLHDL  3 01/12/2018     Last diabetic foot exam was done in 3/19, normal    Examination:   BP 138/82 (BP Location: Left Arm, Patient Position: Sitting, Cuff Size: Normal)   Pulse 68   Ht 5\' 7"  (1.702 m)   Wt 233 lb 9.6 oz (106 kg)   LMP 01/04/2014   SpO2 98%   BMI 36.59 kg/m   Body mass index is 36.59 kg/m.     ASSESSMENT/ PLAN:   Diabetes type 2 with obesity  See history of present illness for detailed discussion of current diabetes management, blood sugar patterns and problems identified  She has a mild diabetes, A1c now 6.6 and usually fairly stable  She is on Victoza but cannot lose weight Also has difficulty improving her diet She has started doing a little walking but has not lost any weight as yet  Currently both Saxenda and Ozempic are not covered by her insurance Recommended that she start checking blood sugars after meals that may help her comply with diet a little better   HYPERLIPIDEMIA:   Well controlled and will check again on the next visit  There are no Patient Instructions on file for this visit.   Elayne Snare 05/27/2018, 3:41 PM

## 2018-06-04 ENCOUNTER — Other Ambulatory Visit: Payer: Self-pay | Admitting: Endocrinology

## 2018-06-09 ENCOUNTER — Other Ambulatory Visit: Payer: Self-pay

## 2018-06-09 MED ORDER — FLUTICASONE PROPIONATE 50 MCG/ACT NA SUSP: 1.0000 | Freq: Every day | NASAL | 1 refills | Status: AC

## 2018-07-31 ENCOUNTER — Other Ambulatory Visit: Payer: Self-pay | Admitting: Endocrinology

## 2018-11-12 ENCOUNTER — Other Ambulatory Visit (INDEPENDENT_AMBULATORY_CARE_PROVIDER_SITE_OTHER): Payer: 59

## 2018-11-12 DIAGNOSIS — E119 Type 2 diabetes mellitus without complications: Secondary | ICD-10-CM | POA: Diagnosis not present

## 2018-11-12 DIAGNOSIS — E782 Mixed hyperlipidemia: Secondary | ICD-10-CM | POA: Diagnosis not present

## 2018-11-12 LAB — COMPREHENSIVE METABOLIC PANEL
ALT: 28 U/L (ref 0–35)
AST: 31 U/L (ref 0–37)
Albumin: 4.4 g/dL (ref 3.5–5.2)
Alkaline Phosphatase: 40 U/L (ref 39–117)
BUN: 16 mg/dL (ref 6–23)
CO2: 28 mEq/L (ref 19–32)
Calcium: 9.2 mg/dL (ref 8.4–10.5)
Chloride: 102 mEq/L (ref 96–112)
Creatinine, Ser: 0.77 mg/dL (ref 0.40–1.20)
GFR: 99.61 mL/min (ref >60.00)
Glucose, Bld: 96 mg/dL (ref 70–99)
Potassium: 3.8 mEq/L (ref 3.5–5.1)
Sodium: 138 mEq/L (ref 135–145)
Total Bilirubin: 0.6 mg/dL (ref 0.2–1.2)
Total Protein: 7.4 g/dL (ref 6.0–8.3)

## 2018-11-12 LAB — LIPID PANEL
Cholesterol: 160 mg/dL (ref 0–200)
HDL: 54.1 mg/dL (ref >39.00)
LDL Cholesterol: 88 mg/dL (ref 0–99)
NonHDL: 106.25
Total CHOL/HDL Ratio: 3
Triglycerides: 89 mg/dL (ref 0.0–149.0)
VLDL: 17.8 mg/dL (ref 0.0–40.0)

## 2018-11-12 LAB — HEMOGLOBIN A1C: Hgb A1c MFr Bld: 6.5 % (ref 4.6–6.5)

## 2018-11-15 ENCOUNTER — Encounter: Payer: Self-pay | Admitting: Endocrinology

## 2018-11-15 ENCOUNTER — Ambulatory Visit (INDEPENDENT_AMBULATORY_CARE_PROVIDER_SITE_OTHER): Payer: 59 | Admitting: Endocrinology

## 2018-11-15 VITALS — BP 118/80 | HR 81 | Ht 67.0 in | Wt 228.6 lb

## 2018-11-15 DIAGNOSIS — E119 Type 2 diabetes mellitus without complications: Secondary | ICD-10-CM | POA: Diagnosis not present

## 2018-11-15 NOTE — Progress Notes (Signed)
Patient ID: Alicia Murray, female   DOB: 11-11-1960, 58 y.o.   MRN: [id number]   Reason for Appointment: Diabetes follow-up   History of Present Illness   Diagnosis: Type 2 DIABETES MELITUS, date of diagnosis:  2007     Previous history: She had significant hyperglycemia diagnosis and was treated with insulin and metformin. However with efforts to lose weight she was able to be treated without medications for sometime. She currently was started on metformin but because of her progressive weight gain she was switched to Victoza with good success and has been on this since 2/14. Currently detailed records are not available  Recent history:   Non-insulin hypoglycemic drugs: Victoza 1.8 mg daily  Her A1c is 6.5 and recent range 6.4-6.6  Current management, blood sugar patterns and problems identified:   She has lost the weight she had gained last July  This is however despite not watching the diet any better  Also has stopped walking which she was doing last summer and is not motivated to do any exercise  She is using an insurance provided glucose monitor at home but only 4 readings in the last 30 days  Has been regular with her Victoza   She does work night shifts as before     Side effects from medications: None       Monitors blood glucose:  none  Glucometer: One Touch.           Blood Glucose readings : Recent range 115-156 with average 128, has only 4 readings for the last 30 days Some readings are after meals  Lab glucose 96 fasting    Meals: 2 meals per day.          Physical activity: exercise: walks 3/7 days             WEIGHT control:  Currently not being managed with any diet or exercise regimen Did not seem to benefit from bupropion 300 mg  Her insurance does not cover weight loss medication   Wt Readings from Last 3 Encounters:  11/15/18 228 lb 9.6 oz (103.7 kg)  05/27/18 233 lb 9.6 oz (106 kg)  01/14/18 228 lb 9.6 oz (103.7 kg)     LABS:  Lab Results  Component Value Date   HGBA1C 6.5 11/12/2018   HGBA1C 6.6 (H) 05/24/2018   HGBA1C 6.4 01/12/2018   Lab Results  Component Value Date   MICROALBUR 5.1 (H) 09/15/2017   LDLCALC 88 11/12/2018   CREATININE 0.77 11/12/2018    Lab on 11/12/2018  Component Date Value Ref Range Status  . Cholesterol 11/12/2018 160  0 - 200 mg/dL Final   ATP III Classification       Desirable:  < 200 mg/dL               Borderline High:  200 - 239 mg/dL          High:  > = 240 mg/dL  . Triglycerides 11/12/2018 89.0  0.0 - 149.0 mg/dL Final   Normal:  <150 mg/dLBorderline High:  150 - 199 mg/dL  . HDL 11/12/2018 54.10  >39.00 mg/dL Final  . VLDL 11/12/2018 17.8  0.0 - 40.0 mg/dL Final  . LDL Cholesterol 11/12/2018 88  0 - 99 mg/dL Final  . Total CHOL/HDL Ratio 11/12/2018 3   Final                  Men          Women1/2  Average Risk     3.4          3.3Average Risk          5.0          4.42X Average Risk          9.6          7.13X Average Risk          15.0          11.0                      . NonHDL 11/12/2018 106.25   Final   NOTE:  Non-HDL goal should be 30 mg/dL higher than patient's LDL goal (i.e. LDL goal of < 70 mg/dL, would have non-HDL goal of < 100 mg/dL)  . Sodium 11/12/2018 138  135 - 145 mEq/L Final  . Potassium 11/12/2018 3.8  3.5 - 5.1 mEq/L Final  . Chloride 11/12/2018 102  96 - 112 mEq/L Final  . CO2 11/12/2018 28  19 - 32 mEq/L Final  . Glucose, Bld 11/12/2018 96  70 - 99 mg/dL Final  . BUN 11/12/2018 16  6 - 23 mg/dL Final  . Creatinine, Ser 11/12/2018 0.77  0.40 - 1.20 mg/dL Final  . Total Bilirubin 11/12/2018 0.6  0.2 - 1.2 mg/dL Final  . Alkaline Phosphatase 11/12/2018 40  39 - 117 U/L Final  . AST 11/12/2018 31  0 - 37 U/L Final  . ALT 11/12/2018 28  0 - 35 U/L Final  . Total Protein 11/12/2018 7.4  6.0 - 8.3 g/dL Final  . Albumin 11/12/2018 4.4  3.5 - 5.2 g/dL Final  . Calcium 11/12/2018 9.2  8.4 - 10.5 mg/dL Final  . GFR 11/12/2018 99.61  >60.00  mL/min Final  . Hgb A1c MFr Bld 11/12/2018 6.5  4.6 - 6.5 % Final   Glycemic Control Guidelines for People with Diabetes:Non Diabetic:  <6%Goal of Therapy: <7%Additional Action Suggested:  >8%     Allergies as of 11/15/2018   No Known Allergies     Medication List       Accurate as of November 15, 2018  4:14 PM. Always use your most recent med list.        APPLE CIDER VINEGAR PO Take by mouth.   aspirin 325 MG tablet Take 325 mg by mouth daily.   atorvastatin 10 MG tablet Commonly known as:  LIPITOR TAKE 1 TABLET BY MOUTH EVERY DAY   B-COMPLEX-C PO Take 1 tablet by mouth daily.   BIOTIN MAXIMUM STRENGTH 10 MG Tabs Generic drug:  Biotin Take 1,000 mg by mouth. Once daily   Evening Primrose Oil 1000 MG Caps Take by mouth.   Fish Oil 1000 MG Caps Take by mouth.   fluticasone 50 MCG/ACT nasal spray Commonly known as:  FLONASE Place 1 spray into both nostrils daily.   glucose blood test strip Commonly known as:  ONE TOUCH ULTRA TEST 1 each by Other route 2 (two) times daily. Use as instructed   Insulin Pen Needle 32G X 4 MM Misc Commonly known as:  BD PEN NEEDLE NANO U/F Use one per day with Victoza   niacin 1000 MG CR tablet Commonly known as:  NIASPAN TAKE 1 TABLET (1,000 MG TOTAL) BY MOUTH AT BEDTIME.   nystatin-triamcinolone cream Commonly known as:  MYCOLOG II   ONE TOUCH LANCETS Misc 1 each by Does not apply route 2 (two) times daily.   phenol 1.4 %  Liqd Commonly known as:  CHLORASEPTIC Use as directed 1 spray in the mouth or throat as needed for throat irritation / pain.   Turmeric 500 MG Caps Take by mouth.   VICTOZA 18 MG/3ML Sopn Generic drug:  liraglutide INJECT 0.3 MLS (1.8 MG TOTAL) INTO THE SKIN DAILY.   vitamin B-12 1000 MCG tablet Commonly known as:  CYANOCOBALAMIN Take 1,000 mcg by mouth daily.   Vitamin D3 50 MCG (2000 UT) Tabs Take 1 tablet by mouth daily.   vitamin E 400 UNIT capsule Generic drug:  vitamin E Take 400 Units by  mouth daily.       Allergies: No Known Allergies  Past Medical History:  Diagnosis Date  . Bladder infection   . Diabetes mellitus   . Fibroid   . H/O varicella   . History of measles, mumps, or rubella   . Infertility   . Monilia infection 07/10/08  . Thrombosed external hemorrhoid 08/15/85  . Thyroid enlargement   . Vagina itching 07/10/08  . Vulvitis 08/01/08    Past Surgical History:  Procedure Laterality Date  . BREAST BIOPSY Left 08/08/2013  . MYOMECTOMY ABDOMINAL APPROACH  1977    Family History  Problem Relation Age of Onset  . Cancer Mother        Breast  . Breast cancer Mother   . Cancer Father        Brain  . Diabetes Father   . Hypertension Brother     Social History:  reports that she has quit smoking. She has never used smokeless tobacco. She reports that she does not drink alcohol or use drugs.  Review of Systems:   Lipids:  Has had diabetic dyslipidemia with increased particle number and triglycerides, Has been on combination with statins Also not taking generic Niaspan since she was not able to get Slo-Niacin or history and her HDL had gone down significantly  Has been taking aspirin 30 minutes before niacin Also takes OTC fish oil  LDL particle number previously was 1016 ,had gone up to 1144, previously below 1000  LDL is below 100, HDL improved  Lab Results  Component Value Date   CHOL 160 11/12/2018   HDL 54.10 11/12/2018   LDLCALC 88 11/12/2018   TRIG 89.0 11/12/2018   CHOLHDL 3 11/12/2018     Last diabetic foot exam was done in 3/19, normal    Examination:   BP 118/80 (BP Location: Left Arm, Patient Position: Sitting, Cuff Size: Normal)   Pulse 81   Ht 5\' 7"  (1.702 m)   Wt 228 lb 9.6 oz (103.7 kg)   LMP 01/04/2014   SpO2 97%   BMI 35.80 kg/m   Body mass index is 35.8 kg/m.     ASSESSMENT/ PLAN:   Diabetes type 2 with obesity  See history of present illness for detailed discussion of current diabetes management, blood  sugar patterns and problems identified  She has a mild diabetes, A1c now 6.5  She is on Victoza 1.8 mg which has helped maintain her sugar control However has very little motivation to watch her diet or exercise However since last July her weight has come back down  Appears that Ozempic is not covered by her insurance However since she just filled her Victoza will wait till the next time to start this Discussed how this is different She will start with Ozempic 0.25 mg for 2 weeks then take 0.5 mg for 4 weeks and subsequently 1 mg if no nausea We  will recheck her in June  Encouraged to check readings after meals more consistently so that she can modify her diet better Given her various options of doing indoor exercises   HYPERLIPIDEMIA:   Well controlled on current regimen with LDL 88 and HDL 54 She does not need to take fish oil  There are no Patient Instructions on file for this visit.   Elayne Snare 11/15/2018, 4:14 PM

## 2018-11-15 NOTE — Patient Instructions (Addendum)
Check blood sugars on waking up 2 days a week  Also check blood sugars about 2 hours after meals and do this after different meals by rotation  Recommended blood sugar levels on waking up are 90-120 and about 2 hours after meal is 130-160  Please bring your blood sugar monitor to each visit, thank you  Call for eye report  Call before Victoza out   [place]   Use the following examples for a creative indoor workout (perform each move for 2-3 minutes):   Warm up. Put on some music that makes you feel like moving, and dance around the living room.  Watch exercise shows on TV and move along with them. There are tons of free cable channels that have daily exercise shows on them for all levels - beginner through advanced.   You can easily find a number of exercise videos but use one that will suit your liking and exercise level; you can do these on your own schedule.  Walk up and down the steps.  Do dumbbell curls and presses (if you don't have weights, use full water bottles).  Do assisted squats, keeping your back on a fitness ball against the wall or using the back of the couch for support.  Shadow box: Lift and lower the left leg; jab with the right arm, then the left; then lift and lower the right leg.  Fence (you don't even need swords). Pretend you're holding a sword in each hand. Create an X pattern standing still, then moving forward and back.  Hop on your exercise bike or treadmill -- or, for something different, use a weighted hula hoop. If you don't have any of those, just go back to dancing.  Do abdominal crunches (hold a weighted ball for added resistance).  Cool down with [employer] "I Feel Good" -- or whatever tune makes you feel good

## 2018-12-13 LAB — HM DIABETES EYE EXAM

## 2019-02-02 ENCOUNTER — Other Ambulatory Visit: Payer: Self-pay

## 2019-02-02 ENCOUNTER — Telehealth: Payer: Self-pay | Admitting: Endocrinology

## 2019-02-02 MED ORDER — SEMAGLUTIDE(0.25 OR 0.5MG/DOS) 2 MG/1.5ML ~~LOC~~ SOPN: 0.2500 mg | PEN_INJECTOR | SUBCUTANEOUS | 0 refills | Status: DC

## 2019-02-02 NOTE — Telephone Encounter (Signed)
Called pt back and she stated that Dr. Dwyane Dee told her to call this office when she finished taking Victoza and he would change her medication to [place]. Ozempic was sent to the pharmacy based off of pt's last office visit note.

## 2019-02-02 NOTE — Telephone Encounter (Signed)
Patient would like a call back to discuss a new prescription that she is needing sent into the pharmacy

## 2019-03-08 ENCOUNTER — Other Ambulatory Visit: Payer: Self-pay | Admitting: Endocrinology

## 2019-03-11 ENCOUNTER — Telehealth: Payer: Self-pay

## 2019-03-11 NOTE — Telephone Encounter (Signed)
Received notification from [facility] for refill of Ozempic. Pt is in need of an appt in order to process refill request. LVM requesting returned call.

## 2019-03-12 ENCOUNTER — Other Ambulatory Visit: Payer: Self-pay | Admitting: Endocrinology

## 2019-03-15 ENCOUNTER — Other Ambulatory Visit: Payer: Self-pay

## 2019-03-15 MED ORDER — SEMAGLUTIDE (1 MG/DOSE) 2 MG/1.5ML ~~LOC~~ SOPN: 1.0000 mg | PEN_INJECTOR | SUBCUTANEOUS | 0 refills | Status: DC

## 2019-03-22 ENCOUNTER — Telehealth: Payer: Self-pay | Admitting: Endocrinology

## 2019-03-22 NOTE — Telephone Encounter (Signed)
MEDICATION: Semaglutide, 1 MG/DOSE, (OZEMPIC, 1 MG/DOSE,) 2 MG/1.5ML SOPN  PHARMACY:  [id number]  IS THIS A 90 DAY SUPPLY :   IS PATIENT OUT OF MEDICATION:   IF NOT; HOW MUCH IS LEFT:   LAST APPOINTMENT DATE: @5 /03/2019  NEXT APPOINTMENT DATE:@6 /02/2019  DO WE HAVE YOUR PERMISSION TO LEAVE A DETAILED MESSAGE:  OTHER COMMENTS:  Patient is requesting a 90 day supply  **Let patient know to contact pharmacy at the end of the day to make sure medication is ready. **  ** Please notify patient to allow 48-72 hours to process**  **Encourage patient to contact the pharmacy for refills or they can request refills through [facility]**

## 2019-03-22 NOTE — Telephone Encounter (Signed)
Rx was refilled on 03/15/2019.

## 2019-04-14 ENCOUNTER — Other Ambulatory Visit: Payer: Self-pay

## 2019-04-14 ENCOUNTER — Other Ambulatory Visit (INDEPENDENT_AMBULATORY_CARE_PROVIDER_SITE_OTHER): Payer: 59

## 2019-04-14 DIAGNOSIS — E119 Type 2 diabetes mellitus without complications: Secondary | ICD-10-CM

## 2019-04-15 LAB — COMPREHENSIVE METABOLIC PANEL
ALT: 27 U/L (ref 0–35)
AST: 30 U/L (ref 0–37)
Albumin: 4.3 g/dL (ref 3.5–5.2)
Alkaline Phosphatase: 39 U/L (ref 39–117)
BUN: 18 mg/dL (ref 6–23)
CO2: 26 mEq/L (ref 19–32)
Calcium: 8.8 mg/dL (ref 8.4–10.5)
Chloride: 102 mEq/L (ref 96–112)
Creatinine, Ser: 0.83 mg/dL (ref 0.40–1.20)
GFR: 85.81 mL/min (ref >60.00)
Glucose, Bld: 96 mg/dL (ref 70–99)
Potassium: 3.9 mEq/L (ref 3.5–5.1)
Sodium: 136 mEq/L (ref 135–145)
Total Bilirubin: 0.4 mg/dL (ref 0.2–1.2)
Total Protein: 7.2 g/dL (ref 6.0–8.3)

## 2019-04-15 LAB — MICROALBUMIN / CREATININE URINE RATIO
Creatinine,U: 183.9 mg/dL
Microalb Creat Ratio: 3.3 mg/g (ref 0.0–30.0)
Microalb, Ur: 6.2 mg/dL — ABNORMAL HIGH (ref 0.0–1.9)

## 2019-04-15 LAB — HEMOGLOBIN A1C: Hgb A1c MFr Bld: 6.7 % — ABNORMAL HIGH (ref 4.6–6.5)

## 2019-04-18 ENCOUNTER — Other Ambulatory Visit: Payer: Self-pay

## 2019-04-18 ENCOUNTER — Ambulatory Visit (INDEPENDENT_AMBULATORY_CARE_PROVIDER_SITE_OTHER): Payer: 59 | Admitting: Endocrinology

## 2019-04-18 ENCOUNTER — Encounter: Payer: Self-pay | Admitting: Endocrinology

## 2019-04-18 DIAGNOSIS — E782 Mixed hyperlipidemia: Secondary | ICD-10-CM

## 2019-04-18 DIAGNOSIS — E1165 Type 2 diabetes mellitus with hyperglycemia: Secondary | ICD-10-CM

## 2019-04-18 NOTE — Progress Notes (Signed)
Patient ID: Sylena Lotter, female   DOB: Apr 08, 1961, 58 y.o.   MRN: [id number]  Today's office visit was provided via telemedicine using video technique The patient was explained the limitations of evaluation and management by telemedicine and the availability of in person appointments.  The patient understood the limitations and agreed to proceed. Patient also understood that the telehealth visit is billable. . Location of the patient: Patient's home . Location of the provider: Physician office Only the patient and myself were participating in the encounter   Reason for Appointment: Diabetes follow-up   History of Present Illness   Diagnosis: Type 2 DIABETES MELITUS, date of diagnosis:  2007     Previous history: She had significant hyperglycemia diagnosis and was treated with insulin and metformin. However with efforts to lose weight she was able to be treated without medications for sometime. She currently was started on metformin but because of her progressive weight gain she was switched to Victoza with good success and has been on this since 2/14. Currently detailed records are not available  Recent history:   Non-insulin hypoglycemic drugs: Ozempic 1 mg weekly  A1c slightly higher than before at 6.7 compared to 6.5  Current management, blood sugar patterns and problems identified:   She has lost some more weight reportedly and she thinks her weight is now 223  She has been switched from Victoza to [place] and although she does not think this makes a difference she may be reducing her overall caloric intake  She says that she cannot watch her diet and will eat more despite not being hungry  Also does not watch her sweets  However she has not been walking like she was on her last visit and not motivated to do so   She does work night shifts as before     Side effects from medications: None       Monitors blood glucose:  Sporadically  using glucometer:  Livongo  .           Blood Glucose readings recently reviewed by patient Recent range 97-120 with couple of readings after meals  Recent average reading not available            WEIGHT control:  Currently not being managed with any diet or exercise regimen Did not seem to benefit from bupropion 300 mg tried previously  Her insurance does not cover weight loss medication   Wt Readings from Last 3 Encounters:  11/15/18 228 lb 9.6 oz (103.7 kg)  05/27/18 233 lb 9.6 oz (106 kg)  01/14/18 228 lb 9.6 oz (103.7 kg)    LABS:  Lab Results  Component Value Date   HGBA1C 6.7 (H) 04/14/2019   HGBA1C 6.5 11/12/2018   HGBA1C 6.6 (H) 05/24/2018   Lab Results  Component Value Date   MICROALBUR 6.2 (H) 04/14/2019   LDLCALC 88 11/12/2018   CREATININE 0.83 04/14/2019    Lab on 04/14/2019  Component Date Value Ref Range Status  . Microalb, Ur 04/14/2019 6.2* 0.0 - 1.9 mg/dL Final  . Creatinine,U 04/14/2019 183.9  mg/dL Final  . Microalb Creat Ratio 04/14/2019 3.3  0.0 - 30.0 mg/g Final  . Sodium 04/14/2019 136  135 - 145 mEq/L Final  . Potassium 04/14/2019 3.9  3.5 - 5.1 mEq/L Final  . Chloride 04/14/2019 102  96 - 112 mEq/L Final  . CO2 04/14/2019 26  19 - 32 mEq/L Final  . Glucose, Bld 04/14/2019 96  70 - 99 mg/dL Final  .  BUN 04/14/2019 18  6 - 23 mg/dL Final  . Creatinine, Ser 04/14/2019 0.83  0.40 - 1.20 mg/dL Final  . Total Bilirubin 04/14/2019 0.4  0.2 - 1.2 mg/dL Final  . Alkaline Phosphatase 04/14/2019 39  39 - 117 U/L Final  . AST 04/14/2019 30  0 - 37 U/L Final  . ALT 04/14/2019 27  0 - 35 U/L Final  . Total Protein 04/14/2019 7.2  6.0 - 8.3 g/dL Final  . Albumin 04/14/2019 4.3  3.5 - 5.2 g/dL Final  . Calcium 04/14/2019 8.8  8.4 - 10.5 mg/dL Final  . GFR 04/14/2019 85.81  >60.00 mL/min Final  . Hgb A1c MFr Bld 04/14/2019 6.7* 4.6 - 6.5 % Final   Glycemic Control Guidelines for People with Diabetes:Non Diabetic:  <6%Goal of Therapy: <7%Additional Action Suggested:   >8%     Allergies as of 04/18/2019   No Known Allergies     Medication List       Accurate as of April 18, 2019  2:23 PM. If you have any questions, ask your nurse or doctor.        APPLE CIDER VINEGAR PO Take by mouth.   aspirin 325 MG tablet Take 325 mg by mouth daily.   atorvastatin 10 MG tablet Commonly known as:  LIPITOR TAKE 1 TABLET BY MOUTH EVERY DAY   B-COMPLEX-C PO Take 1 tablet by mouth daily.   Biotin Maximum Strength 10 MG Tabs Generic drug:  Biotin Take 1,000 mg by mouth. Once daily   Evening Primrose Oil 1000 MG Caps Take by mouth.   Fish Oil 1000 MG Caps Take by mouth.   fluticasone 50 MCG/ACT nasal spray Commonly known as:  FLONASE Place 1 spray into both nostrils daily.   glucose blood test strip Commonly known as:  ONE TOUCH ULTRA TEST 1 each by Other route 2 (two) times daily. Use as instructed   Insulin Pen Needle 32G X 4 MM Misc Commonly known as:  BD Pen Needle Nano U/F Use one per day with Victoza   niacin 1000 MG CR tablet Commonly known as:  NIASPAN TAKE 1 TABLET (1,000 MG TOTAL) BY MOUTH AT BEDTIME.   nystatin-triamcinolone cream Commonly known as:  MYCOLOG II   ONE TOUCH LANCETS Misc 1 each by Does not apply route 2 (two) times daily.   phenol 1.4 % Liqd Commonly known as:  CHLORASEPTIC Use as directed 1 spray in the mouth or throat as needed for throat irritation / pain.   Semaglutide (1 MG/DOSE) 2 MG/1.5ML Sopn Commonly known as:  Ozempic (1 MG/DOSE) Inject 1 mg into the skin once a week. Inject 1mg  under the skin once weekly.   Turmeric 500 MG Caps Take by mouth.   vitamin B-12 1000 MCG tablet Commonly known as:  CYANOCOBALAMIN Take 1,000 mcg by mouth daily.   Vitamin D3 50 MCG (2000 UT) Tabs Take 1 tablet by mouth daily.   vitamin E 400 UNIT capsule Generic drug:  vitamin E Take 400 Units by mouth daily.       Allergies: No Known Allergies  Past Medical History:  Diagnosis Date  . Bladder infection    . Diabetes mellitus   . Fibroid   . H/O varicella   . History of measles, mumps, or rubella   . Infertility   . Monilia infection 07/10/08  . Thrombosed external hemorrhoid 08/15/85  . Thyroid enlargement   . Vagina itching 07/10/08  . Vulvitis 08/01/08    Past Surgical History:  Procedure Laterality Date  . BREAST BIOPSY Left 08/08/2013  . MYOMECTOMY ABDOMINAL APPROACH  1977    Family History  Problem Relation Age of Onset  . Cancer Mother        Breast  . Breast cancer Mother   . Cancer Father        Brain  . Diabetes Father   . Hypertension Brother     Social History:  reports that she has quit smoking. She has never used smokeless tobacco. She reports that she does not drink alcohol or use drugs.  Review of Systems:   Lipids:  Has had diabetic dyslipidemia with increased particle number and triglycerides Now not taking generic Niaspan since she was not able to get Slo-Niacin Had been taking aspirin 30 minutes before niacin Also takes OTC fish oil  LDL particle number in 2017 was 1016, prior to that had gone up to 1144, previously below 1000  LDL is below 100 with 10 mg atorvastatin, also HDL normal    Lab Results  Component Value Date   CHOL 160 11/12/2018   HDL 54.10 11/12/2018   LDLCALC 88 11/12/2018   TRIG 89.0 11/12/2018   CHOLHDL 3 11/12/2018     No history of hypertension    Examination:   LMP 01/04/2014   There is no height or weight on file to calculate BMI.     ASSESSMENT/ PLAN:   Diabetes type 2 with obesity  See history of present illness for detailed discussion of current diabetes management, blood sugar patterns and problems identified  She has a mild diabetes, A1c now 6.7  Her main difficulty is not following her diet despite using GLP-1 drug However her weight is coming down Recent blood sugars at home are fairly good Likely however she has some high readings when she does not do well with diet Currently not exercising and  discussed importance of doing this and starting walking program before going to work daily  HYPERLIPIDEMIA:   Well controlled on atorvastatin She can do better diet as discussed before We will recheck her lipoprotein panel on the next visit  No microalbuminuria  Follow-up in 4 months  There are no Patient Instructions on file for this visit.   Elayne Snare 04/18/2019, 2:23 PM

## 2019-06-07 ENCOUNTER — Other Ambulatory Visit: Payer: Self-pay | Admitting: Endocrinology

## 2019-06-07 NOTE — Telephone Encounter (Signed)
Forward to PCP.

## 2019-06-07 NOTE — Telephone Encounter (Signed)
Refill? Or refer to PCP?

## 2019-06-07 NOTE — Telephone Encounter (Signed)
Noted  

## 2019-06-08 ENCOUNTER — Other Ambulatory Visit: Payer: Self-pay

## 2019-06-08 MED ORDER — OZEMPIC (1 MG/DOSE) 2 MG/1.5ML ~~LOC~~ SOPN: 1.0000 mg | PEN_INJECTOR | SUBCUTANEOUS | 0 refills | Status: DC

## 2019-08-08 ENCOUNTER — Telehealth: Payer: Self-pay | Admitting: Endocrinology

## 2019-08-08 ENCOUNTER — Other Ambulatory Visit: Payer: Self-pay

## 2019-08-08 MED ORDER — ATORVASTATIN CALCIUM 10 MG PO TABS: 10.0000 mg | ORAL_TABLET | Freq: Every day | ORAL | 0 refills | Status: DC

## 2019-08-08 NOTE — Telephone Encounter (Signed)
MEDICATION: atorvastatin (LIPITOR) 10 MG tablet  PHARMACY:  [facility] THIS A 90 DAY SUPPLY :   IS PATIENT OUT OF MEDICATION:   IF NOT; HOW MUCH IS LEFT:   LAST APPOINTMENT DATE: @7 /29/2020  NEXT APPOINTMENT DATE:@10 /04/2019  DO WE HAVE YOUR PERMISSION TO LEAVE A DETAILED MESSAGE:  OTHER COMMENTS:    **Let patient know to contact pharmacy at the end of the day to make sure medication is ready. **  ** Please notify patient to allow 48-72 hours to process**  **Encourage patient to contact the pharmacy for refills or they can request refills through [facility]**

## 2019-08-08 NOTE — Telephone Encounter (Signed)
Rx sent 

## 2019-08-16 ENCOUNTER — Other Ambulatory Visit: Payer: Self-pay

## 2019-08-16 ENCOUNTER — Other Ambulatory Visit (INDEPENDENT_AMBULATORY_CARE_PROVIDER_SITE_OTHER): Payer: 59

## 2019-08-16 DIAGNOSIS — E782 Mixed hyperlipidemia: Secondary | ICD-10-CM | POA: Diagnosis not present

## 2019-08-16 DIAGNOSIS — E1165 Type 2 diabetes mellitus with hyperglycemia: Secondary | ICD-10-CM

## 2019-08-16 LAB — COMPREHENSIVE METABOLIC PANEL
ALT: 22 U/L (ref 0–35)
AST: 21 U/L (ref 0–37)
Albumin: 4.2 g/dL (ref 3.5–5.2)
Alkaline Phosphatase: 35 U/L — ABNORMAL LOW (ref 39–117)
BUN: 17 mg/dL (ref 6–23)
CO2: 31 mEq/L (ref 19–32)
Calcium: 9.4 mg/dL (ref 8.4–10.5)
Chloride: 103 mEq/L (ref 96–112)
Creatinine, Ser: 0.81 mg/dL (ref 0.40–1.20)
GFR: 88.16 mL/min (ref >60.00)
Glucose, Bld: 101 mg/dL — ABNORMAL HIGH (ref 70–99)
Potassium: 5 mEq/L (ref 3.5–5.1)
Sodium: 137 mEq/L (ref 135–145)
Total Bilirubin: 0.5 mg/dL (ref 0.2–1.2)
Total Protein: 7.3 g/dL (ref 6.0–8.3)

## 2019-08-16 LAB — LIPID PANEL
Cholesterol: 142 mg/dL (ref 0–200)
HDL: 51 mg/dL (ref >39.00)
LDL Cholesterol: 76 mg/dL (ref 0–99)
NonHDL: 90.91
Total CHOL/HDL Ratio: 3
Triglycerides: 74 mg/dL (ref 0.0–149.0)
VLDL: 14.8 mg/dL (ref 0.0–40.0)

## 2019-08-16 LAB — HEMOGLOBIN A1C: Hgb A1c MFr Bld: 6.3 % (ref 4.6–6.5)

## 2019-08-17 LAB — LIPOPROTEIN ANALYSIS BY NMR
HDL Particle Number: 43.2 umol/L (ref >=30.5)
LDL Particle Number: 961 nmol/L (ref <1000)
LDL Size: 20.6 nm (ref >20.5)
LP-IR Score: 77 — ABNORMAL HIGH (ref <=45)
Small LDL Particle Number: 422 nmol/L (ref <=527)

## 2019-08-18 ENCOUNTER — Other Ambulatory Visit: Payer: Self-pay | Admitting: Family

## 2019-08-18 DIAGNOSIS — Z1231 Encounter for screening mammogram for malignant neoplasm of breast: Secondary | ICD-10-CM

## 2019-08-23 ENCOUNTER — Ambulatory Visit (INDEPENDENT_AMBULATORY_CARE_PROVIDER_SITE_OTHER): Payer: 59 | Admitting: Endocrinology

## 2019-08-23 ENCOUNTER — Encounter: Payer: Self-pay | Admitting: Endocrinology

## 2019-08-23 ENCOUNTER — Other Ambulatory Visit: Payer: Self-pay

## 2019-08-23 VITALS — BP 110/70 | HR 96 | Ht 67.0 in | Wt 216.8 lb

## 2019-08-23 DIAGNOSIS — E782 Mixed hyperlipidemia: Secondary | ICD-10-CM | POA: Diagnosis not present

## 2019-08-23 DIAGNOSIS — E119 Type 2 diabetes mellitus without complications: Secondary | ICD-10-CM

## 2019-08-23 DIAGNOSIS — E049 Nontoxic goiter, unspecified: Secondary | ICD-10-CM | POA: Diagnosis not present

## 2019-08-23 NOTE — Progress Notes (Signed)
Patient ID: Alicia Murray, female   DOB: 11/26/1960, 58 y.o.   MRN: [id number]    Reason for Appointment: Endocrinology follow-up   History of Present Illness   Diagnosis: Type 2 DIABETES MELITUS, date of diagnosis:  2007     Previous history: She had significant hyperglycemia diagnosis and was treated with insulin and metformin. However with efforts to lose weight she was able to be treated without medications for sometime. She currently was started on metformin but because of her progressive weight gain she was switched to Victoza with good success and has been on this since 2/14. Currently detailed records are not available  Recent history:   Non-insulin hypoglycemic drugs: Ozempic 1 mg weekly  Current management, blood sugar patterns and problems identified:  A1c is 6.3 and previously 6.7   She has lost some more weight since her last visit in June  She continues to improve with using Ozempic and blood sugars are averaging to be better controlled also  However she still has not started any exercise program as discussed  Again does not watch her diet and will eat snacks or larger portions as desired  Also does not watch her sweets  Does not think her satiety is any different with Ozempic   She does work night shifts as before     Side effects from medications: None       Monitors blood glucose:  Sporadically  using glucometer: [place]  .           Blood Glucose readings from faxed report on the monitor  30-day range 98-117 with only 5 readings Average before meals 115; nonfasting 108   Wt Readings from Last 3 Encounters:  08/23/19 216 lb 12.8 oz (98.3 kg)  11/15/18 228 lb 9.6 oz (103.7 kg)  05/27/18 233 lb 9.6 oz (106 kg)    LABS:  Lab Results  Component Value Date   HGBA1C 6.3 08/16/2019   HGBA1C 6.7 (H) 04/14/2019   HGBA1C 6.5 11/12/2018   Lab Results  Component Value Date   MICROALBUR 6.2 (H) 04/14/2019   LDLCALC 76 08/16/2019   CREATININE 0.81 08/16/2019    No visits with results within 1 Week(s) from this visit.  Latest known visit with results is:  Lab on 08/16/2019  Component Date Value Ref Range Status  . Cholesterol 08/16/2019 142  0 - 200 mg/dL Final   ATP III Classification       Desirable:  < 200 mg/dL               Borderline High:  200 - 239 mg/dL          High:  > = 240 mg/dL  . Triglycerides 08/16/2019 74.0  0.0 - 149.0 mg/dL Final   Normal:  <150 mg/dLBorderline High:  150 - 199 mg/dL  . HDL 08/16/2019 51.00  >39.00 mg/dL Final  . VLDL 08/16/2019 14.8  0.0 - 40.0 mg/dL Final  . LDL Cholesterol 08/16/2019 76  0 - 99 mg/dL Final  . Total CHOL/HDL Ratio 08/16/2019 3   Final                  Men          Women1/2 Average Risk     3.4          3.3Average Risk          5.0          4.42X Average Risk  9.6          7.13X Average Risk          15.0          11.0                      . NonHDL 08/16/2019 90.91   Final   NOTE:  Non-HDL goal should be 30 mg/dL higher than patient's LDL goal (i.e. LDL goal of < 70 mg/dL, would have non-HDL goal of < 100 mg/dL)  . LDL Particle Number 08/16/2019 [id number]  <1,000 nmol/L Final   Comment:                           Low                   < 1000                           Moderate         1000 - 1299                           Borderline-High  1300 - 1599                           High             1600 - 2000                           Very High             > 2000   . HDL Particle Number 08/16/2019 43.2  >=30.5 umol/L Final  . Small LDL Particle Number 08/16/2019 [id number]  <=527 nmol/L Final  . LDL Size 08/16/2019 20.6  >20.5 nm Final   Comment:  ----------------------------------------------------------                  ** INTERPRETATIVE INFORMATION**                  PARTICLE CONCENTRATION AND SIZE                     <--Lower CVD Risk   Higher CVD Risk-->   LDL AND HDL PARTICLES   Percentile in Reference Population   HDL-P (total)        High     75th    50th    25th    Low                        >34.9    34.9    30.5    26.7   <26.7   Small LDL-P          Low      25th    50th    75th   High                        <117     117     527     839    >839   LDL Size   <-Large (Pattern A)->    <-Small (Pattern B)->  23.0    20.6           20.5      19.0  ---------------------------------------------------------- Small LDL-P and LDL Size are associated with CVD risk, but not after LDL-P is taken into account.   Marland Kitchen LP-IR Score 08/16/2019 77* <=45 Final   Comment: INSULIN RESISTANCE MARKER     <--Insulin Sensitive    Insulin Resistant-->            Percentile in Reference Population Insulin Resistance Score LP-IR Score   Low   25th   50th   75th   High               <27   27     45     63     >63 LP-IR Score is inaccurate if patient is non-fasting. The LP-IR score is a laboratory developed index that has been associated with insulin resistance and diabetes risk and should be used as one component of a physician's clinical assessment.   . Sodium 08/16/2019 137  135 - 145 mEq/L Final  . Potassium 08/16/2019 5.0  3.5 - 5.1 mEq/L Final  . Chloride 08/16/2019 103  96 - 112 mEq/L Final  . CO2 08/16/2019 31  19 - 32 mEq/L Final  . Glucose, Bld 08/16/2019 101* 70 - 99 mg/dL Final  . BUN 08/16/2019 17  6 - 23 mg/dL Final  . Creatinine, Ser 08/16/2019 0.81  0.40 - 1.20 mg/dL Final  . Total Bilirubin 08/16/2019 0.5  0.2 - 1.2 mg/dL Final  . Alkaline Phosphatase 08/16/2019 35* 39 - 117 U/L Final  . AST 08/16/2019 21  0 - 37 U/L Final  . ALT 08/16/2019 22  0 - 35 U/L Final  . Total Protein 08/16/2019 7.3  6.0 - 8.3 g/dL Final  . Albumin 08/16/2019 4.2  3.5 - 5.2 g/dL Final  . Calcium 08/16/2019 9.4  8.4 - 10.5 mg/dL Final  . GFR 08/16/2019 88.16  >60.00 mL/min Final  . Hgb A1c MFr Bld 08/16/2019 6.3  4.6 - 6.5 % Final   Glycemic Control Guidelines for People with Diabetes:Non Diabetic:  <6%Goal of Therapy: <7%Additional Action Suggested:  >8%      Allergies as of 08/23/2019   No Known Allergies     Medication List       Accurate as of August 23, 2019  1:27 PM. If you have any questions, ask your nurse or doctor.        APPLE CIDER VINEGAR PO Take by mouth.   aspirin 325 MG tablet Take 325 mg by mouth daily.   atorvastatin 10 MG tablet Commonly known as: LIPITOR Take 1 tablet (10 mg total) by mouth daily.   B-COMPLEX-C PO Take 1 tablet by mouth daily.   Biotin Maximum Strength 10 MG Tabs Generic drug: Biotin Take 1,000 mg by mouth. Once daily   Evening Primrose Oil 1000 MG Caps Take by mouth.   Fish Oil 1000 MG Caps Take by mouth.   fluticasone 50 MCG/ACT nasal spray Commonly known as: FLONASE Place 1 spray into both nostrils daily.   glucose blood test strip Commonly known as: ONE TOUCH ULTRA TEST 1 each by Other route 2 (two) times daily. Use as instructed   Insulin Pen Needle 32G X 4 MM Misc Commonly known as: BD Pen Needle Nano U/F Use one per day with Victoza   niacin 1000 MG CR tablet Commonly known as: NIASPAN TAKE 1 TABLET (1,000 MG TOTAL) BY MOUTH AT  BEDTIME.   nystatin-triamcinolone cream Commonly known as: MYCOLOG II   ONE TOUCH LANCETS Misc 1 each by Does not apply route 2 (two) times daily.   Ozempic (1 MG/DOSE) 2 MG/1.5ML Sopn Generic drug: Semaglutide (1 MG/DOSE) Inject 1 mg into the skin once a week. Inject 1mg  under the skin once weekly.   phenol 1.4 % Liqd Commonly known as: CHLORASEPTIC Use as directed 1 spray in the mouth or throat as needed for throat irritation / pain.   Turmeric 500 MG Caps Take by mouth.   vitamin B-12 1000 MCG tablet Commonly known as: CYANOCOBALAMIN Take 1,000 mcg by mouth daily.   Vitamin D3 50 MCG (2000 UT) Tabs Take 1 tablet by mouth daily.   vitamin E 400 UNIT capsule Generic drug: vitamin E Take 400 Units by mouth daily.       Allergies: No Known Allergies  Past Medical History:  Diagnosis Date  . Bladder infection   .  Diabetes mellitus   . Fibroid   . H/O varicella   . History of measles, mumps, or rubella   . Infertility   . Monilia infection 07/10/08  . Thrombosed external hemorrhoid 08/15/85  . Thyroid enlargement   . Vagina itching 07/10/08  . Vulvitis 08/01/08    Past Surgical History:  Procedure Laterality Date  . BREAST BIOPSY Left 08/08/2013  . MYOMECTOMY ABDOMINAL APPROACH  1977    Family History  Problem Relation Age of Onset  . Cancer Mother        Breast  . Breast cancer Mother   . Cancer Father        Brain  . Diabetes Father   . Hypertension Brother     Social History:  reports that she has quit smoking. She has never used smokeless tobacco. She reports that she does not drink alcohol or use drugs.  Review of Systems:   Lipids:  Has had diabetic dyslipidemia with increased particle number and triglycerides Now taking generic Niaspan without any significant flushing or itching Had been taking aspirin 30 minutes before niacin  Also takes OTC fish oil  LDL particle number in 2017 was 1016, prior to that had gone up to 1144 More recently this is back down to 961 with normal particle size  LDL is below 100 with 10 mg atorvastatin, also HDL normal    Lab Results  Component Value Date   CHOL 142 08/16/2019   HDL 51.00 08/16/2019   LDLCALC 76 08/16/2019   TRIG 74.0 08/16/2019   CHOLHDL 3 08/16/2019     No history of hypertension  She thinks she has had some depressive symptoms but has not been recommended treatment by her PCP    Examination:   BP 110/70 (BP Location: Left Arm, Patient Position: Sitting, Cuff Size: Normal)   Pulse 96   Ht 5\' 7"  (1.702 m)   Wt 216 lb 12.8 oz (98.3 kg)   LMP 01/04/2014   SpO2 98%   BMI 33.96 kg/m   Body mass index is 33.96 kg/m.     ASSESSMENT/ PLAN:   Diabetes type 2 with obesity  See history of present illness for detailed discussion of current diabetes management, blood sugar patterns and problems identified  Her  A1c is slightly better at 6.3 compared to 6.7  She appears to be losing weight better with Ozempic compared to Victoza However she is still not motivated to watch her diet or exercise Blood sugars are generally near normal and relatively higher fasting  She will continue 1 mg Ozempic Encouraged her to start walking in the afternoons daily before going to work   HYPERLIPIDEMIA:   Well controlled on atorvastatin and Niaspan LDL particle number has improved and may be from weight loss  She will discuss potential treatment of depression with PCP  Follow-up in 6 months  There are no Patient Instructions on file for this visit.   Elayne Snare 08/23/2019, 1:27 PM

## 2019-08-23 NOTE — Patient Instructions (Signed)
Walk daily  Check blood sugars on waking up days a week  Also check blood sugars about 2 hours after meals and do this after different meals by rotation  Recommended blood sugar levels on waking up are 90-130 and about 2 hours after meal is 130-160  Please bring your blood sugar monitor to each visit, thank you

## 2019-08-31 ENCOUNTER — Other Ambulatory Visit: Payer: Self-pay | Admitting: Endocrinology

## 2019-09-01 ENCOUNTER — Other Ambulatory Visit: Payer: Self-pay | Admitting: Endocrinology

## 2019-09-26 ENCOUNTER — Other Ambulatory Visit: Payer: Self-pay | Admitting: Endocrinology

## 2019-10-04 ENCOUNTER — Ambulatory Visit
Admission: RE | Admit: 2019-10-04 | Discharge: 2019-10-04 | Disposition: A | Discharge status: Home / Self Care | Payer: 59 | Source: Ambulatory Visit | Attending: Family | Admitting: Family

## 2019-10-04 ENCOUNTER — Other Ambulatory Visit: Payer: Self-pay

## 2019-10-04 DIAGNOSIS — Z1231 Encounter for screening mammogram for malignant neoplasm of breast: Secondary | ICD-10-CM

## 2019-10-31 ENCOUNTER — Other Ambulatory Visit: Payer: Self-pay | Admitting: Endocrinology

## 2019-11-16 ENCOUNTER — Other Ambulatory Visit: Payer: Self-pay | Admitting: Endocrinology

## 2020-02-13 ENCOUNTER — Other Ambulatory Visit: Payer: Self-pay | Admitting: Endocrinology

## 2020-02-14 ENCOUNTER — Other Ambulatory Visit: Payer: Self-pay

## 2020-02-14 ENCOUNTER — Other Ambulatory Visit (INDEPENDENT_AMBULATORY_CARE_PROVIDER_SITE_OTHER): Payer: 59

## 2020-02-14 DIAGNOSIS — E119 Type 2 diabetes mellitus without complications: Secondary | ICD-10-CM

## 2020-02-14 DIAGNOSIS — E049 Nontoxic goiter, unspecified: Secondary | ICD-10-CM

## 2020-02-14 DIAGNOSIS — E782 Mixed hyperlipidemia: Secondary | ICD-10-CM | POA: Diagnosis not present

## 2020-02-14 LAB — COMPREHENSIVE METABOLIC PANEL
ALT: 16 U/L (ref 0–35)
AST: 19 U/L (ref 0–37)
Albumin: 3.9 g/dL (ref 3.5–5.2)
Alkaline Phosphatase: 33 U/L — ABNORMAL LOW (ref 39–117)
BUN: 16 mg/dL (ref 6–23)
CO2: 28 mEq/L (ref 19–32)
Calcium: 8.6 mg/dL (ref 8.4–10.5)
Chloride: 105 mEq/L (ref 96–112)
Creatinine, Ser: 0.92 mg/dL (ref 0.40–1.20)
GFR: 75.98 mL/min (ref >60.00)
Glucose, Bld: 106 mg/dL — ABNORMAL HIGH (ref 70–99)
Potassium: 3.6 mEq/L (ref 3.5–5.1)
Sodium: 139 mEq/L (ref 135–145)
Total Bilirubin: 0.4 mg/dL (ref 0.2–1.2)
Total Protein: 6.8 g/dL (ref 6.0–8.3)

## 2020-02-14 LAB — TSH: TSH: 0.85 u[IU]/mL (ref 0.35–4.50)

## 2020-02-14 LAB — LIPID PANEL
Cholesterol: 138 mg/dL (ref 0–200)
HDL: 49.8 mg/dL (ref >39.00)
LDL Cholesterol: 61 mg/dL (ref 0–99)
NonHDL: 88.09
Total CHOL/HDL Ratio: 3
Triglycerides: 134 mg/dL (ref 0.0–149.0)
VLDL: 26.8 mg/dL (ref 0.0–40.0)

## 2020-02-14 LAB — HEMOGLOBIN A1C: Hgb A1c MFr Bld: 6 % (ref 4.6–6.5)

## 2020-02-21 ENCOUNTER — Ambulatory Visit (INDEPENDENT_AMBULATORY_CARE_PROVIDER_SITE_OTHER): Payer: 59 | Admitting: Endocrinology

## 2020-02-21 ENCOUNTER — Other Ambulatory Visit: Payer: Self-pay

## 2020-02-21 ENCOUNTER — Encounter: Payer: Self-pay | Admitting: Endocrinology

## 2020-02-21 VITALS — BP 110/70 | HR 67 | Ht 67.0 in | Wt 212.2 lb

## 2020-02-21 DIAGNOSIS — E119 Type 2 diabetes mellitus without complications: Secondary | ICD-10-CM | POA: Diagnosis not present

## 2020-02-21 DIAGNOSIS — E049 Nontoxic goiter, unspecified: Secondary | ICD-10-CM

## 2020-02-21 DIAGNOSIS — E782 Mixed hyperlipidemia: Secondary | ICD-10-CM | POA: Diagnosis not present

## 2020-02-21 LAB — GLUCOSE, POCT (MANUAL RESULT ENTRY): POC Glucose: 111 mg/dl — AB (ref 70–99)

## 2020-02-21 NOTE — Progress Notes (Signed)
Patient ID: Alicia Murray, female   DOB: 29-Aug-1961, 59 y.o.   MRN: [id number]    Reason for Appointment: Endocrinology follow-up   History of Present Illness   Diagnosis: Type 2 DIABETES MELITUS, date of diagnosis:  2007     Previous history: She had significant hyperglycemia diagnosis and was treated with insulin and metformin. However with efforts to lose weight she was able to be treated without medications for sometime. She currently was started on metformin but because of her progressive weight gain she was switched to Victoza with good success and has been on this since 2/14. Currently detailed records are not available  She was started on Ozempic in 11/2018  Recent history:   Non-insulin hypoglycemic drugs: Ozempic 1 mg weekly  Current management, blood sugar patterns and problems identified:  A1c is 6% compared to 6.3   She has lost some more weight since her last visit in 10/20  She continues to have better control as well as weight loss with using Ozempic  This is despite her not watching her diet, eating sweets, not controlling types of meals or portions  Ozempic does not control her hunger sensation  Despite repeated reminders she does not do any exercise, she thinks that she is too tired to go working 12-hour days, sometimes 6 days a week  Has not checked her blood sugars at home 111 today and 106 in the lab   She does work night shifts as before     Side effects from medications: None       Monitors blood glucose:  Was  using glucometer: [employer]  .      No readings available now       Blood Glucose readings previously:  30-day range 98-117 with only 5 readings Average before meals 115; nonfasting 108   Wt Readings from Last 3 Encounters:  02/21/20 212 lb 3.2 oz (96.3 kg)  08/23/19 216 lb 12.8 oz (98.3 kg)  11/15/18 228 lb 9.6 oz (103.7 kg)    LABS:  Lab Results  Component Value Date   HGBA1C 6.0 02/14/2020   HGBA1C 6.3  08/16/2019   HGBA1C 6.7 (H) 04/14/2019   Lab Results  Component Value Date   MICROALBUR 6.2 (H) 04/14/2019   LDLCALC 61 02/14/2020   CREATININE 0.92 02/14/2020    No visits with results within 1 Week(s) from this visit.  Latest known visit with results is:  Lab on 02/14/2020  Component Date Value Ref Range Status  . TSH 02/14/2020 0.85  0.35 - 4.50 uIU/mL Final  . Cholesterol 02/14/2020 138  0 - 200 mg/dL Final   ATP III Classification       Desirable:  < 200 mg/dL               Borderline High:  200 - 239 mg/dL          High:  > = 240 mg/dL  . Triglycerides 02/14/2020 134.0  0.0 - 149.0 mg/dL Final   Normal:  <150 mg/dLBorderline High:  150 - 199 mg/dL  . HDL 02/14/2020 49.80  >39.00 mg/dL Final  . VLDL 02/14/2020 26.8  0.0 - 40.0 mg/dL Final  . LDL Cholesterol 02/14/2020 61  0 - 99 mg/dL Final  . Total CHOL/HDL Ratio 02/14/2020 3   Final                  Men          Women1/2 Average Risk  3.4          3.3Average Risk          5.0          4.42X Average Risk          9.6          7.13X Average Risk          15.0          11.0                      . NonHDL 02/14/2020 88.09   Final   NOTE:  Non-HDL goal should be 30 mg/dL higher than patient's LDL goal (i.e. LDL goal of < 70 mg/dL, would have non-HDL goal of < 100 mg/dL)  . Sodium 02/14/2020 139  135 - 145 mEq/L Final  . Potassium 02/14/2020 3.6  3.5 - 5.1 mEq/L Final  . Chloride 02/14/2020 105  96 - 112 mEq/L Final  . CO2 02/14/2020 28  19 - 32 mEq/L Final  . Glucose, Bld 02/14/2020 106* 70 - 99 mg/dL Final  . BUN 02/14/2020 16  6 - 23 mg/dL Final  . Creatinine, Ser 02/14/2020 0.92  0.40 - 1.20 mg/dL Final  . Total Bilirubin 02/14/2020 0.4  0.2 - 1.2 mg/dL Final  . Alkaline Phosphatase 02/14/2020 33* 39 - 117 U/L Final  . AST 02/14/2020 19  0 - 37 U/L Final  . ALT 02/14/2020 16  0 - 35 U/L Final  . Total Protein 02/14/2020 6.8  6.0 - 8.3 g/dL Final  . Albumin 02/14/2020 3.9  3.5 - 5.2 g/dL Final  . GFR 02/14/2020 75.98   >60.00 mL/min Final  . Calcium 02/14/2020 8.6  8.4 - 10.5 mg/dL Final  . Hgb A1c MFr Bld 02/14/2020 6.0  4.6 - 6.5 % Final   Glycemic Control Guidelines for People with Diabetes:Non Diabetic:  <6%Goal of Therapy: <7%Additional Action Suggested:  >8%     Allergies as of 02/21/2020   No Known Allergies     Medication List       Accurate as of February 21, 2020  1:47 PM. If you have any questions, ask your nurse or doctor.        STOP taking these medications   Fish Oil 1000 MG Caps Stopped by: Elayne Snare, MD   phenol 1.4 % Liqd Commonly known as: CHLORASEPTIC Stopped by: Elayne Snare, MD     TAKE these medications   APPLE CIDER VINEGAR PO Take by mouth.   aspirin 325 MG tablet Take 325 mg by mouth daily.   atorvastatin 10 MG tablet Commonly known as: LIPITOR TAKE 1 TABLET BY MOUTH EVERY DAY   B-COMPLEX-C PO Take 1 tablet by mouth daily.   Biotin Maximum Strength 10 MG Tabs Generic drug: Biotin Take 1,000 mg by mouth. Once daily   Evening Primrose Oil 1000 MG Caps Take by mouth.   fluticasone 50 MCG/ACT nasal spray Commonly known as: FLONASE Place 1 spray into both nostrils daily.   glucose blood test strip Commonly known as: ONE TOUCH ULTRA TEST 1 each by Other route 2 (two) times daily. Use as instructed   Insulin Pen Needle 32G X 4 MM Misc Commonly known as: BD Pen Needle Nano U/F Use one per day with Victoza   niacin 1000 MG CR tablet Commonly known as: NIASPAN TAKE 1 TABLET (1,000 MG TOTAL) BY MOUTH AT BEDTIME.   nystatin-triamcinolone cream Commonly known as: MYCOLOG II   ONE  TOUCH LANCETS Misc 1 each by Does not apply route 2 (two) times daily.   Ozempic (1 MG/DOSE) 2 MG/1.5ML Sopn Generic drug: Semaglutide (1 MG/DOSE) INJECT 1 MG INTO THE SKIN ONCE A WEEK. INJECT 1MG  UNDER THE SKIN ONCE WEEKLY.   Turmeric 500 MG Caps Take by mouth.   vitamin B-12 1000 MCG tablet Commonly known as: CYANOCOBALAMIN Take 1,000 mcg by mouth daily.   Vitamin D3  50 MCG (2000 UT) Tabs Take 1 tablet by mouth daily.   vitamin E 180 MG (400 UNITS) capsule Generic drug: vitamin E Take 400 Units by mouth daily.       Allergies: No Known Allergies  Past Medical History:  Diagnosis Date  . Bladder infection   . Diabetes mellitus   . Fibroid   . H/O varicella   . History of measles, mumps, or rubella   . Infertility   . Monilia infection 07/10/08  . Thrombosed external hemorrhoid 08/15/85  . Thyroid enlargement   . Vagina itching 07/10/08  . Vulvitis 08/01/08    Past Surgical History:  Procedure Laterality Date  . BREAST BIOPSY Left 08/08/2013  . MYOMECTOMY ABDOMINAL APPROACH  1977    Family History  Problem Relation Age of Onset  . Cancer Mother        Breast  . Breast cancer Mother   . Cancer Father        Brain  . Diabetes Father   . Hypertension Brother     Social History:  reports that she has quit smoking. She has never used smokeless tobacco. She reports that she does not drink alcohol or use drugs.  Review of Systems:   Lipids:  Has had diabetic dyslipidemia with increased particle number and triglycerides  Continues to be taking generic Niaspan without any significant flushing or itching Had been taking aspirin 30 minutes before niacin  Also takes OTC fish oil and atorvastatin 10 mg daily  LDL particle number in 2017 was 1016, prior to that had gone up to 1144 Her last level was 961 with normal particle size  LDL is below 100 with 10 mg atorvastatin, improved since 2020 Also HDL normal as before  Lab Results  Component Value Date   CHOL 138 02/14/2020   CHOL 142 08/16/2019   CHOL 160 11/12/2018   Lab Results  Component Value Date   HDL 49.80 02/14/2020   HDL 51.00 08/16/2019   HDL 54.10 11/12/2018   Lab Results  Component Value Date   LDLCALC 61 02/14/2020   LDLCALC 76 08/16/2019   [place] 88 11/12/2018   Lab Results  Component Value Date   TRIG 134.0 02/14/2020   TRIG 74.0 08/16/2019   TRIG  89.0 11/12/2018   Lab Results  Component Value Date   CHOLHDL 3 02/14/2020   CHOLHDL 3 08/16/2019   CHOLHDL 3 11/12/2018   No results found for: LDLDIRECT    No history of hypertension  She thinks she has had depressive symptoms are not improved with trying 5 mg Lexapro from PCP    Examination:   BP 110/70 (BP Location: Left Arm, Patient Position: Sitting, Cuff Size: Normal)   Pulse 67   Ht 5\' 7"  (1.702 m)   Wt 212 lb 3.2 oz (96.3 kg)   LMP 01/04/2014   SpO2 96%   BMI 33.24 kg/m   Body mass index is 33.24 kg/m.     ASSESSMENT/ PLAN:   Diabetes type 2 with obesity  See history of present illness for detailed  discussion of current diabetes management, blood sugar patterns and problems identified  Her A1c is slightly better at 6  She appears to be losing weight progressively with Ozempic compared to Victoza Again she is still not motivated to watch her diet or exercise Blood sugars are generally near normal but not checked at home  She will continue 1 mg Ozempic Encouraged her to start walking with her dog at least on her days off Discussed benefits of exercise Also discussed that if she is able to lose weight with lifestyle changes she may be able to reduce her medications   HYPERLIPIDEMIA:   Well controlled on atorvastatin and Niaspan No change recommended  History of thyroid enlargement: TSH is consistently normal  Depression: Suggested that she will need a higher dose of Lexapro and she will talk to her PCP about this  Follow-up in 6 months  There are no Patient Instructions on file for this visit.   Elayne Snare 02/21/2020, 1:47 PM

## 2020-05-05 ENCOUNTER — Other Ambulatory Visit: Payer: Self-pay | Admitting: Endocrinology

## 2020-08-21 ENCOUNTER — Other Ambulatory Visit: Payer: Self-pay

## 2020-08-21 ENCOUNTER — Other Ambulatory Visit (INDEPENDENT_AMBULATORY_CARE_PROVIDER_SITE_OTHER): Payer: 59

## 2020-08-21 DIAGNOSIS — E119 Type 2 diabetes mellitus without complications: Secondary | ICD-10-CM | POA: Diagnosis not present

## 2020-08-21 LAB — LIPID PANEL
Cholesterol: 122 mg/dL (ref 0–200)
HDL: 57.6 mg/dL (ref >39.00)
LDL Cholesterol: 53 mg/dL (ref 0–99)
NonHDL: 64.64
Total CHOL/HDL Ratio: 2
Triglycerides: 60 mg/dL (ref 0.0–149.0)
VLDL: 12 mg/dL (ref 0.0–40.0)

## 2020-08-21 LAB — COMPREHENSIVE METABOLIC PANEL
ALT: 17 U/L (ref 0–35)
AST: 22 U/L (ref 0–37)
Albumin: 4 g/dL (ref 3.5–5.2)
Alkaline Phosphatase: 35 U/L — ABNORMAL LOW (ref 39–117)
BUN: 15 mg/dL (ref 6–23)
CO2: 32 mEq/L (ref 19–32)
Calcium: 8.5 mg/dL (ref 8.4–10.5)
Chloride: 103 mEq/L (ref 96–112)
Creatinine, Ser: 0.82 mg/dL (ref 0.40–1.20)
GFR: 78.82 mL/min (ref >60.00)
Glucose, Bld: 86 mg/dL (ref 70–99)
Potassium: 3.7 mEq/L (ref 3.5–5.1)
Sodium: 139 mEq/L (ref 135–145)
Total Bilirubin: 0.4 mg/dL (ref 0.2–1.2)
Total Protein: 7 g/dL (ref 6.0–8.3)

## 2020-08-21 LAB — HEMOGLOBIN A1C: Hgb A1c MFr Bld: 6.2 % (ref 4.6–6.5)

## 2020-08-21 LAB — MICROALBUMIN / CREATININE URINE RATIO
Creatinine,U: 153.5 mg/dL
Microalb Creat Ratio: 1 mg/g (ref 0.0–30.0)
Microalb, Ur: 1.5 mg/dL (ref 0.0–1.9)

## 2020-08-28 ENCOUNTER — Encounter: Payer: Self-pay | Admitting: Endocrinology

## 2020-08-28 ENCOUNTER — Ambulatory Visit (INDEPENDENT_AMBULATORY_CARE_PROVIDER_SITE_OTHER): Payer: 59 | Admitting: Endocrinology

## 2020-08-28 ENCOUNTER — Other Ambulatory Visit: Payer: Self-pay

## 2020-08-28 VITALS — BP 112/72 | HR 64 | Wt 217.8 lb

## 2020-08-28 DIAGNOSIS — E782 Mixed hyperlipidemia: Secondary | ICD-10-CM | POA: Diagnosis not present

## 2020-08-28 DIAGNOSIS — E119 Type 2 diabetes mellitus without complications: Secondary | ICD-10-CM

## 2020-08-28 NOTE — Progress Notes (Signed)
Patient ID: Alicia Murray, female   DOB: [birth year], 59 y.o.   MRN: [id number]    Reason for Appointment: Endocrinology follow-up   History of Present Illness   Diagnosis: Type 2 DIABETES MELITUS, date of diagnosis:  2007     Previous history: She had significant hyperglycemia diagnosis and was treated with insulin and metformin. However with efforts to lose weight she was able to be treated without medications for sometime. She currently was started on metformin but because of her progressive weight gain she was switched to Victoza with good success and has been on this since 2/14. Currently detailed records are not available  She was started on Ozempic in 11/2018  Recent history:   Non-insulin hypoglycemic drugs: Ozempic 1 mg weekly  Current management, blood sugar patterns and problems identified:  A1c is about the same at 6.2 % compared to 6.0   She has again not doing any exercise despite repeated reminders  She says that she is usually on her feet all the time she is working at the [employer]  With her not being able to control her diet was eating sweets like candy bars she cannot lose any weight despite taking Ozempic  Also does not check her sugars at home  Lab glucose 86 fasting  Has not missed any doses of Ozempic   She does work night shifts     Side effects from medications: None       Monitors blood glucose:  Was  using glucometer: [employer]  .      No readings available           Wt Readings from Last 3 Encounters:  08/28/20 217 lb 12.8 oz (98.8 kg)  02/21/20 212 lb 3.2 oz (96.3 kg)  08/23/19 216 lb 12.8 oz (98.3 kg)    LABS:  Lab Results  Component Value Date   HGBA1C 6.2 08/21/2020   HGBA1C 6.0 02/14/2020   HGBA1C 6.3 08/16/2019   Lab Results  Component Value Date   MICROALBUR 1.5 08/21/2020   LDLCALC 53 08/21/2020   CREATININE 0.82 08/21/2020   Other active problems see review of systems  No visits with results  within 1 Week(s) from this visit.  Latest known visit with results is:  Lab on 08/21/2020  Component Date Value Ref Range Status   Cholesterol 08/21/2020 122  0 - 200 mg/dL Final   ATP III Classification       Desirable:  < 200 mg/dL               Borderline High:  200 - 239 mg/dL          High:  > = 240 mg/dL   Triglycerides 08/21/2020 60.0  0 - 149 mg/dL Final   Normal:  <150 mg/dLBorderline High:  150 - 199 mg/dL   HDL 08/21/2020 57.60  >39.00 mg/dL Final   VLDL 08/21/2020 12.0  0.0 - 40.0 mg/dL Final   LDL Cholesterol 08/21/2020 53  0 - 99 mg/dL Final   Total CHOL/HDL Ratio 08/21/2020 2   Final                  Men          Women1/2 Average Risk     3.4          3.3Average Risk          5.0          4.42X Average Risk  9.6          7.13X Average Risk          15.0          11.0                       NonHDL 08/21/2020 64.64   Final   NOTE:  Non-HDL goal should be 30 mg/dL higher than patient's LDL goal (i.e. LDL goal of < 70 mg/dL, would have non-HDL goal of < 100 mg/dL)   Microalb, Ur 08/21/2020 1.5  0.0 - 1.9 mg/dL Final   Creatinine,U 08/21/2020 153.5  mg/dL Final   Microalb Creat Ratio 08/21/2020 1.0  0.0 - 30.0 mg/g Final   Sodium 08/21/2020 139  135 - 145 mEq/L Final   Potassium 08/21/2020 3.7  3.5 - 5.1 mEq/L Final   Chloride 08/21/2020 103  96 - 112 mEq/L Final   CO2 08/21/2020 32  19 - 32 mEq/L Final   Glucose, Bld 08/21/2020 86  70 - 99 mg/dL Final   BUN 08/21/2020 15  6 - 23 mg/dL Final   Creatinine, Ser 08/21/2020 0.82  0.40 - 1.20 mg/dL Final   Total Bilirubin 08/21/2020 0.4  0.2 - 1.2 mg/dL Final   Alkaline Phosphatase 08/21/2020 35* 39 - 117 U/L Final   AST 08/21/2020 22  0 - 37 U/L Final   ALT 08/21/2020 17  0 - 35 U/L Final   Total Protein 08/21/2020 7.0  6.0 - 8.3 g/dL Final   Albumin 08/21/2020 4.0  3.5 - 5.2 g/dL Final   GFR 08/21/2020 78.82  >60.00 mL/min Final   Calcium 08/21/2020 8.5  8.4 - 10.5 mg/dL Final   Hgb A1c MFr Bld  08/21/2020 6.2  4.6 - 6.5 % Final   Glycemic Control Guidelines for People with Diabetes:Non Diabetic:  <6%Goal of Therapy: <7%Additional Action Suggested:  >8%     Allergies as of 08/28/2020   No Known Allergies     Medication List       Accurate as of August 28, 2020  2:51 PM. If you have any questions, ask your nurse or doctor.        APPLE CIDER VINEGAR PO Take by mouth.   aspirin 325 MG tablet Take 325 mg by mouth daily.   atorvastatin 10 MG tablet Commonly known as: LIPITOR TAKE 1 TABLET BY MOUTH EVERY DAY   B-COMPLEX-C PO Take 1 tablet by mouth daily.   Biotin Maximum Strength 10 MG Tabs Generic drug: Biotin Take 1,000 mg by mouth. Once daily   escitalopram 5 MG tablet Commonly known as: LEXAPRO Take 5 mg by mouth daily.   Evening Primrose Oil 1000 MG Caps Take by mouth.   fluticasone 50 MCG/ACT nasal spray Commonly known as: FLONASE Place 1 spray into both nostrils daily.   glucose blood test strip Commonly known as: ONE TOUCH ULTRA TEST 1 each by Other route 2 (two) times daily. Use as instructed   Insulin Pen Needle 32G X 4 MM Misc Commonly known as: BD Pen Needle Nano U/F Use one per day with Victoza   niacin 1000 MG CR tablet Commonly known as: NIASPAN TAKE 1 TABLET (1,000 MG TOTAL) BY MOUTH AT BEDTIME.   nystatin-triamcinolone cream Commonly known as: MYCOLOG II   ONE TOUCH LANCETS Misc 1 each by Does not apply route 2 (two) times daily.   Ozempic (1 MG/DOSE) 2 MG/1.5ML Sopn Generic drug: Semaglutide (1 MG/DOSE) INJECT 1 MG INTO THE SKIN  ONCE A WEEK. INJECT 1MG  UNDER THE SKIN ONCE WEEKLY.   Turmeric 500 MG Caps Take by mouth.   vitamin B-12 1000 MCG tablet Commonly known as: CYANOCOBALAMIN Take 1,000 mcg by mouth daily.   Vitamin D3 50 MCG (2000 UT) Tabs Take 1 tablet by mouth daily.   vitamin E 180 MG (400 UNITS) capsule Generic drug: vitamin E Take 400 Units by mouth daily.       Allergies: No Known Allergies  Past  Medical History:  Diagnosis Date   Bladder infection    Diabetes mellitus    Fibroid    H/O varicella    History of measles, mumps, or rubella    Infertility    Monilia infection 07/10/08   Thrombosed external hemorrhoid 08/15/85   Thyroid enlargement    Vagina itching 07/10/08   Vulvitis 08/01/08    Past Surgical History:  Procedure Laterality Date   BREAST BIOPSY Left 08/08/2013   MYOMECTOMY ABDOMINAL APPROACH  1977    Family History  Problem Relation Age of Onset   Cancer Mother        Breast   Breast cancer Mother    Cancer Father        Brain   Diabetes Father    Hypertension Brother     Social History:  reports that she has quit smoking. She has never used smokeless tobacco. She reports that she does not drink alcohol and does not use drugs.  Review of Systems:   Lipids:  Has had diabetic dyslipidemia with increased particle number and triglycerides  Continues to be taking generic Niaspan without any significant flushing or itching Had been taking aspirin 30 minutes before niacin  Also takes OTC fish oil and atorvastatin 10 mg daily  LDL particle number in 2017 was 1016, prior to that had gone up to 1144 Her last level was 961 with normal particle size  LDL is consistently below 100 with 10 mg atorvastatin, improved since 2020 HDL is relatively better  This is despite her diet still being poor with eating a lot of sweets and recently gaining weight  Lab Results  Component Value Date   CHOL 122 08/21/2020   CHOL 138 02/14/2020   CHOL 142 08/16/2019   Lab Results  Component Value Date   HDL 57.60 08/21/2020   HDL 49.80 02/14/2020   HDL 51.00 08/16/2019   Lab Results  Component Value Date   LDLCALC 53 08/21/2020   LDLCALC 61 02/14/2020   LDLCALC 76 08/16/2019   Lab Results  Component Value Date   TRIG 60.0 08/21/2020   TRIG 134.0 02/14/2020   TRIG 74.0 08/16/2019   Lab Results  Component Value Date   CHOLHDL 2 08/21/2020    CHOLHDL 3 02/14/2020   CHOLHDL 3 08/16/2019   No results found for: LDLDIRECT    No history of hypertension, repeat blood pressure was improved      Examination:   BP 112/72    Pulse 64    Wt 217 lb 12.8 oz (98.8 kg)    LMP 01/04/2014    SpO2 95%    BMI 34.11 kg/m   Body mass index is 34.11 kg/m.   Diabetic Foot Exam - Simple   Simple Foot Form Diabetic Foot exam was performed with the following findings: Yes   Visual Inspection No deformities, no ulcerations, no other skin breakdown bilaterally: Yes Sensation Testing Intact to touch and monofilament testing bilaterally: Yes Pulse Check Posterior Tibialis and Dorsalis pulse intact bilaterally: Yes  Comments       ASSESSMENT/ PLAN:   Diabetes type 2 with obesity  See history of present illness for detailed discussion of current diabetes management, blood sugar patterns and problems identified  Her A1c is still excellent at 6.2  Although she is benefiting from [place] her weight has gone back up now This is likely to be from inadequate control of total calorie intake As before she does not exercise and is not motivated  She will continue 1 mg Ozempic weekly She was recommended trying to do with her walking or dancing program on the weekends and she will think about it   HYPERLIPIDEMIA:   Well controlled on atorvastatin and Niaspan She will stay on the same dose of each Again recommended trying to improve her diet with cutting back on junk food like candy bars  Blood pressure is normal without medications, microalbumin also normal now  Follow-up in 6 months  There are no Patient Instructions on file for this visit.   Elayne Snare 08/28/2020, 2:51 PM

## 2020-09-14 ENCOUNTER — Other Ambulatory Visit: Payer: Self-pay

## 2020-09-14 MED ORDER — ATORVASTATIN CALCIUM 10 MG PO TABS: 10.0000 mg | ORAL_TABLET | Freq: Every day | ORAL | 1 refills | Status: DC

## 2020-09-25 ENCOUNTER — Other Ambulatory Visit: Payer: Self-pay | Admitting: Endocrinology

## 2020-11-28 ENCOUNTER — Other Ambulatory Visit: Payer: Self-pay | Admitting: Endocrinology

## 2021-02-26 ENCOUNTER — Other Ambulatory Visit: Payer: Self-pay

## 2021-02-26 ENCOUNTER — Other Ambulatory Visit (INDEPENDENT_AMBULATORY_CARE_PROVIDER_SITE_OTHER): Payer: 59

## 2021-02-26 DIAGNOSIS — E119 Type 2 diabetes mellitus without complications: Secondary | ICD-10-CM

## 2021-02-26 DIAGNOSIS — E782 Mixed hyperlipidemia: Secondary | ICD-10-CM | POA: Diagnosis not present

## 2021-02-26 LAB — LIPID PANEL
Cholesterol: 182 mg/dL (ref 0–200)
HDL: 62.2 mg/dL (ref >39.00)
LDL Cholesterol: 98 mg/dL (ref 0–99)
NonHDL: 120.13
Total CHOL/HDL Ratio: 3
Triglycerides: 109 mg/dL (ref 0.0–149.0)
VLDL: 21.8 mg/dL (ref 0.0–40.0)

## 2021-02-26 LAB — COMPREHENSIVE METABOLIC PANEL
ALT: 21 U/L (ref 0–35)
AST: 21 U/L (ref 0–37)
Albumin: 4 g/dL (ref 3.5–5.2)
Alkaline Phosphatase: 35 U/L — ABNORMAL LOW (ref 39–117)
BUN: 17 mg/dL (ref 6–23)
CO2: 29 mEq/L (ref 19–32)
Calcium: 9.1 mg/dL (ref 8.4–10.5)
Chloride: 102 mEq/L (ref 96–112)
Creatinine, Ser: 0.83 mg/dL (ref 0.40–1.20)
GFR: 77.6 mL/min (ref >60.00)
Glucose, Bld: 99 mg/dL (ref 70–99)
Potassium: 3.9 mEq/L (ref 3.5–5.1)
Sodium: 138 mEq/L (ref 135–145)
Total Bilirubin: 0.4 mg/dL (ref 0.2–1.2)
Total Protein: 7.2 g/dL (ref 6.0–8.3)

## 2021-02-26 LAB — HEMOGLOBIN A1C: Hgb A1c MFr Bld: 6.3 % (ref 4.6–6.5)

## 2021-02-28 ENCOUNTER — Ambulatory Visit (INDEPENDENT_AMBULATORY_CARE_PROVIDER_SITE_OTHER): Payer: 59 | Admitting: Endocrinology

## 2021-02-28 ENCOUNTER — Other Ambulatory Visit: Payer: Self-pay

## 2021-02-28 ENCOUNTER — Encounter: Payer: Self-pay | Admitting: Endocrinology

## 2021-02-28 DIAGNOSIS — E119 Type 2 diabetes mellitus without complications: Secondary | ICD-10-CM

## 2021-02-28 DIAGNOSIS — E782 Mixed hyperlipidemia: Secondary | ICD-10-CM

## 2021-02-28 NOTE — Progress Notes (Signed)
Patient ID: Alicia Murray, female   DOB: 1961-08-13, 60 y.o.   MRN: [id number]    Reason for Appointment: Endocrinology follow-up   History of Present Illness   Diagnosis: Type 2 DIABETES MELITUS, date of diagnosis:  2007     Previous history: She had significant hyperglycemia diagnosis and was treated with insulin and metformin. However with efforts to lose weight she was able to be treated without medications for sometime. She currently was started on metformin but because of her progressive weight gain she was switched to Victoza with good success and has been on this since 2/14. Currently detailed records are not available  She was started on Ozempic in 11/2018  Recent history:   Non-insulin hypoglycemic drugs: Ozempic 1 mg weekly  Current management, blood sugar patterns and problems identified:  A1c is about the same at 6.3 %    She has again not motivated to start exercising  As before she is eating sweets on a daily basis and does not think she has any satiety from [place]  She usually will tend to snack even when she is not hungry  As before has not checked blood sugars at home  About a few weeks ago she had forgotten to take her Ozempic and may not have taken 3 or 4 doses  However she does not think her satiety changed with this  She has gained a significant amount of weight  Lab fasting blood sugar 99   She does work night shifts     Side effects from medications: None       Glucose monitoring:  .      No readings available          Wt Readings from Last 3 Encounters:  02/28/21 231 lb 9.6 oz (105.1 kg)  08/28/20 217 lb 12.8 oz (98.8 kg)  02/21/20 212 lb 3.2 oz (96.3 kg)    LABS:  Lab Results  Component Value Date   HGBA1C 6.3 02/26/2021   HGBA1C 6.2 08/21/2020   HGBA1C 6.0 02/14/2020   Lab Results  Component Value Date   MICROALBUR 1.5 08/21/2020   LDLCALC 98 02/26/2021   CREATININE 0.83 02/26/2021   Other active problems  see review of systems  Lab on 02/26/2021  Component Date Value Ref Range Status  . Cholesterol 02/26/2021 182  0 - 200 mg/dL Final   ATP III Classification       Desirable:  < 200 mg/dL               Borderline High:  200 - 239 mg/dL          High:  > = 240 mg/dL  . Triglycerides 02/26/2021 109.0  0.0 - 149.0 mg/dL Final   Normal:  <150 mg/dLBorderline High:  150 - 199 mg/dL  . HDL 02/26/2021 62.20  >39.00 mg/dL Final  . VLDL 02/26/2021 21.8  0.0 - 40.0 mg/dL Final  . LDL Cholesterol 02/26/2021 98  0 - 99 mg/dL Final  . Total CHOL/HDL Ratio 02/26/2021 3   Final                  Men          Women1/2 Average Risk     3.4          3.3Average Risk          5.0          4.42X Average Risk  9.6          7.13X Average Risk          15.0          11.0                      . NonHDL 02/26/2021 120.13   Final   NOTE:  Non-HDL goal should be 30 mg/dL higher than patient's LDL goal (i.e. LDL goal of < 70 mg/dL, would have non-HDL goal of < 100 mg/dL)  . Sodium 02/26/2021 138  135 - 145 mEq/L Final  . Potassium 02/26/2021 3.9  3.5 - 5.1 mEq/L Final  . Chloride 02/26/2021 102  96 - 112 mEq/L Final  . CO2 02/26/2021 29  19 - 32 mEq/L Final  . Glucose, Bld 02/26/2021 99  70 - 99 mg/dL Final  . BUN 02/26/2021 17  6 - 23 mg/dL Final  . Creatinine, Ser 02/26/2021 0.83  0.40 - 1.20 mg/dL Final  . Total Bilirubin 02/26/2021 0.4  0.2 - 1.2 mg/dL Final  . Alkaline Phosphatase 02/26/2021 35* 39 - 117 U/L Final  . AST 02/26/2021 21  0 - 37 U/L Final  . ALT 02/26/2021 21  0 - 35 U/L Final  . Total Protein 02/26/2021 7.2  6.0 - 8.3 g/dL Final  . Albumin 02/26/2021 4.0  3.5 - 5.2 g/dL Final  . GFR 02/26/2021 77.60  >60.00 mL/min Final   Calculated using the CKD-EPI Creatinine Equation (2021)  . Calcium 02/26/2021 9.1  8.4 - 10.5 mg/dL Final  . Hgb A1c MFr Bld 02/26/2021 6.3  4.6 - 6.5 % Final   Glycemic Control Guidelines for People with Diabetes:Non Diabetic:  <6%Goal of Therapy: <7%Additional Action  Suggested:  >8%     Allergies as of 02/28/2021   No Known Allergies     Medication List       Accurate as of February 28, 2021  1:46 PM. If you have any questions, ask your nurse or doctor.        STOP taking these medications   escitalopram 5 MG tablet Commonly known as: LEXAPRO Stopped by: Elayne Snare, MD     TAKE these medications   APPLE CIDER VINEGAR PO Take by mouth.   aspirin 325 MG tablet Take 325 mg by mouth daily.   atorvastatin 10 MG tablet Commonly known as: LIPITOR Take 1 tablet (10 mg total) by mouth daily.   B-COMPLEX-C PO Take 1 tablet by mouth daily.   Biotin 10 MG Tabs Take 1,000 mg by mouth. Once daily   Evening Primrose Oil 1000 MG Caps Take by mouth.   fluticasone 50 MCG/ACT nasal spray Commonly known as: FLONASE Place 1 spray into both nostrils daily.   glucose blood test strip Commonly known as: ONE TOUCH ULTRA TEST 1 each by Other route 2 (two) times daily. Use as instructed   Insulin Pen Needle 32G X 4 MM Misc Commonly known as: BD Pen Needle Nano U/F Use one per day with Victoza   niacin 1000 MG CR tablet Commonly known as: NIASPAN TAKE 1 TABLET (1,000 MG TOTAL) BY MOUTH AT BEDTIME.   nystatin-triamcinolone cream Commonly known as: MYCOLOG II   ONE TOUCH LANCETS Misc 1 each by Does not apply route 2 (two) times daily.   Ozempic (1 MG/DOSE) 4 MG/3ML Sopn Generic drug: Semaglutide (1 MG/DOSE) INJECT 1 MG INTO THE SKIN ONCE A WEEK.   Turmeric 500 MG Caps Take by mouth.   vitamin  B-12 1000 MCG tablet Commonly known as: CYANOCOBALAMIN Take 1,000 mcg by mouth daily.   Vitamin D3 50 MCG (2000 UT) Tabs Take 1 tablet by mouth daily.   vitamin E 180 MG (400 UNITS) capsule Take 400 Units by mouth daily.       Allergies: No Known Allergies  Past Medical History:  Diagnosis Date  . Bladder infection   . Diabetes mellitus   . Fibroid   . H/O varicella   . History of measles, mumps, or rubella   . Infertility   .  Monilia infection 07/10/08  . Thrombosed external hemorrhoid 08/15/85  . Thyroid enlargement   . Vagina itching 07/10/08  . Vulvitis 08/01/08    Past Surgical History:  Procedure Laterality Date  . BREAST BIOPSY Left 08/08/2013  . MYOMECTOMY ABDOMINAL APPROACH  1977    Family History  Problem Relation Age of Onset  . Cancer Mother        Breast  . Breast cancer Mother   . Cancer Father        Brain  . Diabetes Father   . Hypertension Brother     Social History:  reports that she has quit smoking. She has never used smokeless tobacco. She reports that she does not drink alcohol and does not use drugs.  Review of Systems:   Lipids:  Has had diabetic dyslipidemia with increased particle number and triglycerides  Continues to be taking generic Niaspan without any significant flushing or itching Had been taking aspirin 30 minutes before niacin  Also takes OTC fish oil and atorvastatin 10 mg daily  LDL particle number in 2017 was 1016, prior to that had gone up to 1144 Her last level was 961 with normal particle size  LDL is consistently below 100 with 10 mg atorvastatin However LDL is relatively higher, previously as low as 53 HDL is relatively better  Still still has a poor diet with eating a lot of sweets and again gaining weight  Lab Results  Component Value Date   CHOL 182 02/26/2021   CHOL 122 08/21/2020   CHOL 138 02/14/2020   Lab Results  Component Value Date   HDL 62.20 02/26/2021   HDL 57.60 08/21/2020   HDL 49.80 02/14/2020   Lab Results  Component Value Date   LDLCALC 98 02/26/2021   LDLCALC 53 08/21/2020   LDLCALC 61 02/14/2020   Lab Results  Component Value Date   TRIG 109.0 02/26/2021   TRIG 60.0 08/21/2020   TRIG 134.0 02/14/2020   Lab Results  Component Value Date   CHOLHDL 3 02/26/2021   CHOLHDL 2 08/21/2020   CHOLHDL 3 02/14/2020   No results found for: LDLDIRECT         Examination:   BP 128/80   Pulse 69   Ht 5\' 7"   (1.702 m)   Wt 231 lb 9.6 oz (105.1 kg)   LMP 01/04/2014   SpO2 99%   BMI 36.27 kg/m   Body mass index is 36.27 kg/m.      ASSESSMENT/ PLAN:   Diabetes type 2 with obesity  See history of present illness for detailed discussion of current diabetes management, blood sugar patterns and problems identified  Her A1c is still excellent at 6.2  She has again increased weight gain This is likely to be from inadequate control of total calorie intake, usually does not exercise despite reminders  She will continue 1 mg Ozempic weekly but will increasing to 2 mg and this is available  She will be seen by bariatric weight loss program since she is not able to motivate herself to do it on her own   HYPERLIPIDEMIA:   Well controlled on atorvastatin and Niaspan LDL is relatively higher likely to be from poor diet Again recommended trying to improve her diet with cutting back on junk food like candy bars   Follow-up in 6 months  Patient Instructions  Walk briskly daily  Keep food diary  Call when ready for 1mg  Ozempic refill    Elayne Snare 02/28/2021, 1:46 PM

## 2021-02-28 NOTE — Patient Instructions (Addendum)
Walk briskly daily  Keep food diary  Call when ready for 1mg  Ozempic refill

## 2021-03-14 ENCOUNTER — Other Ambulatory Visit: Payer: Self-pay | Admitting: Endocrinology

## 2021-03-20 ENCOUNTER — Other Ambulatory Visit: Payer: Self-pay | Admitting: Endocrinology

## 2021-03-20 MED ORDER — OZEMPIC (2 MG/DOSE) 8 MG/3ML ~~LOC~~ SOPN: PEN_INJECTOR | SUBCUTANEOUS | 3 refills | Status: DC

## 2021-06-13 ENCOUNTER — Other Ambulatory Visit: Payer: Self-pay | Admitting: Endocrinology

## 2021-07-17 ENCOUNTER — Other Ambulatory Visit: Payer: Self-pay | Admitting: Family

## 2021-07-17 DIAGNOSIS — Z1231 Encounter for screening mammogram for malignant neoplasm of breast: Secondary | ICD-10-CM

## 2021-07-30 ENCOUNTER — Ambulatory Visit
Admission: RE | Admit: 2021-07-30 | Discharge: 2021-07-30 | Disposition: A | Discharge status: Home / Self Care | Payer: 59 | Source: Ambulatory Visit | Attending: Family | Admitting: Family

## 2021-07-30 ENCOUNTER — Other Ambulatory Visit: Payer: Self-pay

## 2021-07-30 DIAGNOSIS — Z1231 Encounter for screening mammogram for malignant neoplasm of breast: Secondary | ICD-10-CM

## 2021-08-13 NOTE — Telephone Encounter (Signed)
Called and left message for patient to come and get sample.

## 2021-09-03 ENCOUNTER — Other Ambulatory Visit: Payer: Self-pay

## 2021-09-03 ENCOUNTER — Other Ambulatory Visit (INDEPENDENT_AMBULATORY_CARE_PROVIDER_SITE_OTHER): Payer: 59

## 2021-09-03 DIAGNOSIS — E119 Type 2 diabetes mellitus without complications: Secondary | ICD-10-CM | POA: Diagnosis not present

## 2021-09-03 DIAGNOSIS — E782 Mixed hyperlipidemia: Secondary | ICD-10-CM

## 2021-09-03 LAB — LIPID PANEL
Cholesterol: 121 mg/dL (ref 0–200)
HDL: 44.6 mg/dL (ref >39.00)
LDL Cholesterol: 58 mg/dL (ref 0–99)
NonHDL: 75.99
Total CHOL/HDL Ratio: 3
Triglycerides: 90 mg/dL (ref 0.0–149.0)
VLDL: 18 mg/dL (ref 0.0–40.0)

## 2021-09-03 LAB — COMPREHENSIVE METABOLIC PANEL
ALT: 21 U/L (ref 0–35)
AST: 27 U/L (ref 0–37)
Albumin: 4.2 g/dL (ref 3.5–5.2)
Alkaline Phosphatase: 38 U/L — ABNORMAL LOW (ref 39–117)
BUN: 12 mg/dL (ref 6–23)
CO2: 27 mEq/L (ref 19–32)
Calcium: 8.8 mg/dL (ref 8.4–10.5)
Chloride: 103 mEq/L (ref 96–112)
Creatinine, Ser: 0.92 mg/dL (ref 0.40–1.20)
GFR: 68.33 mL/min (ref >60.00)
Glucose, Bld: 98 mg/dL (ref 70–99)
Potassium: 3.6 mEq/L (ref 3.5–5.1)
Sodium: 139 mEq/L (ref 135–145)
Total Bilirubin: 0.4 mg/dL (ref 0.2–1.2)
Total Protein: 7.3 g/dL (ref 6.0–8.3)

## 2021-09-03 LAB — HEMOGLOBIN A1C: Hgb A1c MFr Bld: 6.1 % (ref 4.6–6.5)

## 2021-09-09 ENCOUNTER — Ambulatory Visit (INDEPENDENT_AMBULATORY_CARE_PROVIDER_SITE_OTHER): Payer: 59 | Admitting: Endocrinology

## 2021-09-09 ENCOUNTER — Encounter: Payer: Self-pay | Admitting: Endocrinology

## 2021-09-09 ENCOUNTER — Other Ambulatory Visit: Payer: Self-pay

## 2021-09-09 VITALS — BP 124/80 | HR 71 | Ht 67.0 in | Wt 224.0 lb

## 2021-09-09 DIAGNOSIS — E782 Mixed hyperlipidemia: Secondary | ICD-10-CM

## 2021-09-09 DIAGNOSIS — E040 Nontoxic diffuse goiter: Secondary | ICD-10-CM | POA: Diagnosis not present

## 2021-09-09 DIAGNOSIS — E119 Type 2 diabetes mellitus without complications: Secondary | ICD-10-CM | POA: Diagnosis not present

## 2021-09-09 LAB — GLUCOSE, POCT (MANUAL RESULT ENTRY): POC Glucose: 107 mg/dl — AB (ref 70–99)

## 2021-09-09 MED ORDER — OZEMPIC (2 MG/DOSE) 8 MG/3ML ~~LOC~~ SOPN: PEN_INJECTOR | SUBCUTANEOUS | 3 refills | Status: DC

## 2021-09-09 NOTE — Patient Instructions (Addendum)
Get BIKE !  Niaspan 1 tab daily  Check blood sugars on waking up days a week  Also check blood sugars about 2 hours after meals and do this after different meals by rotation  Recommended blood sugar levels on waking up are 90-130 and about 2 hours after meal is 130-160  Please bring your blood sugar monitor to each visit, thank you

## 2021-09-09 NOTE — Progress Notes (Signed)
cbg  

## 2021-09-09 NOTE — Progress Notes (Signed)
Patient ID: Alicia Murray, female   DOB: 09/05/1961, 60 y.o.   MRN: [id number]    Reason for Appointment: Endocrinology follow-up   History of Present Illness   Diagnosis: Type 2 DIABETES MELITUS, date of diagnosis:  2007     Previous history: She had significant hyperglycemia diagnosis and was treated with insulin and metformin. However with efforts to lose weight she was able to be treated without medications for sometime. She currently was started on metformin but because of her progressive weight gain she was switched to Victoza with good success and has been on this since 2/14. Currently detailed records are not available  She was started on Ozempic in 11/2018  Recent history:   Non-insulin hypoglycemic drugs: Ozempic 2 mg weekly  Current management, blood sugar patterns and problems identified:  A1c is slightly better at 6.1  She has not check her blood sugars again despite reminders Also not motivated to start exercising as discussed on each visit Still thinks that she does not have time to exercise Cannot find somebody to walk with She is not able to restrict sweets or carbohydrates  Also she may have snacks even when she is not hungry  Even though she has not changed her diet or exercise regimen she has finally lost 7 pounds since her last visit, previously was gaining significant amount of weight  No nausea from Ozempic 2 mg but she is notable to get the supply filled at the pharmacy, using samples She was supposed to go to the weight loss clinic but she refused to make an appointment Lab fasting blood sugar 98   She does work night shifts     Side effects from medications: None       Glucose monitoring:  .      No readings available          Wt Readings from Last 3 Encounters:  09/09/21 224 lb (101.6 kg)  02/28/21 231 lb 9.6 oz (105.1 kg)  08/28/20 217 lb 12.8 oz (98.8 kg)    LABS:  Lab Results  Component Value Date   HGBA1C 6.1  09/03/2021   HGBA1C 6.3 02/26/2021   HGBA1C 6.2 08/21/2020   Lab Results  Component Value Date   MICROALBUR 1.5 08/21/2020   LDLCALC 58 09/03/2021   CREATININE 0.92 09/03/2021   Other active problems see review of systems  Lab on 09/03/2021  Component Date Value Ref Range Status   Cholesterol 09/03/2021 121  0 - 200 mg/dL Final   ATP III Classification       Desirable:  < 200 mg/dL               Borderline High:  200 - 239 mg/dL          High:  > = 240 mg/dL   Triglycerides 09/03/2021 90.0  0.0 - 149.0 mg/dL Final   Normal:  <150 mg/dLBorderline High:  150 - 199 mg/dL   HDL 09/03/2021 44.60  >39.00 mg/dL Final   VLDL 09/03/2021 18.0  0.0 - 40.0 mg/dL Final   LDL Cholesterol 09/03/2021 58  0 - 99 mg/dL Final   Total CHOL/HDL Ratio 09/03/2021 3   Final                  Men          Women1/2 Average Risk     3.4          3.3Average Risk  5.0          4.42X Average Risk          9.6          7.13X Average Risk          15.0          11.0                       NonHDL 09/03/2021 75.99   Final   NOTE:  Non-HDL goal should be 30 mg/dL higher than patient's LDL goal (i.e. LDL goal of < 70 mg/dL, would have non-HDL goal of < 100 mg/dL)   Sodium 09/03/2021 139  135 - 145 mEq/L Final   Potassium 09/03/2021 3.6  3.5 - 5.1 mEq/L Final   Chloride 09/03/2021 103  96 - 112 mEq/L Final   CO2 09/03/2021 27  19 - 32 mEq/L Final   Glucose, Bld 09/03/2021 98  70 - 99 mg/dL Final   BUN 09/03/2021 12  6 - 23 mg/dL Final   Creatinine, Ser 09/03/2021 0.92  0.40 - 1.20 mg/dL Final   Total Bilirubin 09/03/2021 0.4  0.2 - 1.2 mg/dL Final   Alkaline Phosphatase 09/03/2021 38 (A)  39 - 117 U/L Final   AST 09/03/2021 27  0 - 37 U/L Final   ALT 09/03/2021 21  0 - 35 U/L Final   Total Protein 09/03/2021 7.3  6.0 - 8.3 g/dL Final   Albumin 09/03/2021 4.2  3.5 - 5.2 g/dL Final   GFR 09/03/2021 68.33  >60.00 mL/min Final   Calculated using the CKD-EPI Creatinine Equation (2021)   Calcium 09/03/2021 8.8   8.4 - 10.5 mg/dL Final   Hgb A1c MFr Bld 09/03/2021 6.1  4.6 - 6.5 % Final   Glycemic Control Guidelines for People with Diabetes:Non Diabetic:  <6%Goal of Therapy: <7%Additional Action Suggested:  >8%     Allergies as of 09/09/2021   No Known Allergies      Medication List        Accurate as of September 09, 2021  1:28 PM. If you have any questions, ask your nurse or doctor.          APPLE CIDER VINEGAR PO Take by mouth.   aspirin 325 MG tablet Take 325 mg by mouth daily.   atorvastatin 10 MG tablet Commonly known as: LIPITOR TAKE 1 TABLET BY MOUTH EVERY DAY   B-COMPLEX-C PO Take 1 tablet by mouth daily.   Biotin 10 MG Tabs Take 1,000 mg by mouth. Once daily   Evening Primrose Oil 1000 MG Caps Take by mouth.   fluticasone 50 MCG/ACT nasal spray Commonly known as: FLONASE Place 1 spray into both nostrils daily.   glucose blood test strip Commonly known as: ONE TOUCH ULTRA TEST 1 each by Other route 2 (two) times daily. Use as instructed   Insulin Pen Needle 32G X 4 MM Misc Commonly known as: BD Pen Needle Nano U/F Use one per day with Victoza   niacin 1000 MG CR tablet Commonly known as: NIASPAN TAKE 1 TABLET (1,000 MG TOTAL) BY MOUTH AT BEDTIME.   nystatin-triamcinolone cream Commonly known as: MYCOLOG II   ONE TOUCH LANCETS Misc 1 each by Does not apply route 2 (two) times daily.   Ozempic (2 MG/DOSE) 8 MG/3ML Sopn Generic drug: Semaglutide (2 MG/DOSE) Inject 2 mg subcutaneously once a week   Turmeric 500 MG Caps Take by mouth.   vitamin B-12 1000 MCG tablet  Commonly known as: CYANOCOBALAMIN Take 1,000 mcg by mouth daily.   Vitamin D3 50 MCG (2000 UT) Tabs Take 1 tablet by mouth daily.   vitamin E 180 MG (400 UNITS) capsule Take 400 Units by mouth daily.        Allergies: No Known Allergies  Past Medical History:  Diagnosis Date   Bladder infection    Diabetes mellitus    Fibroid    H/O varicella    History of measles, mumps,  or rubella    Infertility    Monilia infection 07/10/08   Thrombosed external hemorrhoid 08/15/85   Thyroid enlargement    Vagina itching 07/10/08   Vulvitis 08/01/08    Past Surgical History:  Procedure Laterality Date   BREAST BIOPSY Left 08/08/2013   MYOMECTOMY ABDOMINAL APPROACH  1977    Family History  Problem Relation Age of Onset   Cancer Mother        Breast   Breast cancer Mother    Cancer Father        Brain   Diabetes Father    Hypertension Brother     Social History:  reports that she has quit smoking. She has never used smokeless tobacco. She reports that she does not drink alcohol and does not use drugs.  Review of Systems:   Lipids:  Has had diabetic dyslipidemia with increased particle number and triglycerides  He is on generic Niaspan without any significant flushing or itching However she thinks that she is irregular with this and may be taking half the time However she thinks she is trying to take it twice a day even though prescription is for 1 day and may be running out of time Had been taking aspirin 30 minutes before niacin  Also takes OTC fish oil and atorvastatin 10 mg daily  LDL particle number in 2017 was 1016, prior to that had gone up to 1144 Her last level was 961 with normal particle size  LDL is consistently below 100 with 10 mg atorvastatin and also improved since last visit possibly from weight loss  HDL is relatively lower likely from irregular Niaspan  Lab Results  Component Value Date   CHOL 121 09/03/2021   CHOL 182 02/26/2021   CHOL 122 08/21/2020   Lab Results  Component Value Date   HDL 44.60 09/03/2021   HDL 62.20 02/26/2021   HDL 57.60 08/21/2020   Lab Results  Component Value Date   LDLCALC 58 09/03/2021   LDLCALC 98 02/26/2021   LDLCALC 53 08/21/2020   Lab Results  Component Value Date   TRIG 90.0 09/03/2021   TRIG 109.0 02/26/2021   TRIG 60.0 08/21/2020   Lab Results  Component Value Date   CHOLHDL 3  09/03/2021   CHOLHDL 3 02/26/2021   CHOLHDL 2 08/21/2020   No results found for: LDLDIRECT  THYROID enlargement: This has been present since at least 2014 and CT scan did not show any nodules She has no local discomfort or swallowing difficulty  Lab Results  Component Value Date   TSH 0.85 02/14/2020   TSH 0.93 01/12/2018   TSH 1.02 04/17/2014   FREET4 0.91 08/11/2013      Examination:   BP 124/80 (BP Location: Left Arm, Patient Position: Sitting, Cuff Size: Normal)   Pulse 71   Ht 5\' 7"  (1.702 m)   Wt 224 lb (101.6 kg)   LMP 01/04/2014   SpO2 97%   BMI 35.08 kg/m   Body mass index is 35.08  kg/m.   She has a small fleshy thyroid enlargement bilaterally but almost 3 times normal on the right, but twice normal on the left and softer No lymphadenopathy in the neck   ASSESSMENT/ PLAN:   Diabetes type 2 with obesity  See history of present illness for detailed discussion of current diabetes management, blood sugar patterns and problems identified  Her A1c is still excellent at 6.1  She is on Ozempic 2 mg weekly now  She has not changed her lifestyle but likely has been taking her Ozempic more regularly than before and may be doing better with the higher dose Again discussed need for better motivation to exercise, watch her diet and check her sugars  She does seem interested in getting an exercise bike at home as she will not do any exercise Recommended that she keep a check on her exercise history with her apple watch Continue 2 mg Ozempic   HYPERLIPIDEMIA:   Well controlled on atorvastatin and Niaspan LDL is improved although HDL is likely lower from irregular niacin Discussed that she can take niacin once a day since this is a 30 mg and should be sufficient  Simple goiter: Check TSH periodically   Follow-up in 6 months  There are no Patient Instructions on file for this visit.   Elayne Snare 09/09/2021, 1:28 PM

## 2021-09-21 ENCOUNTER — Other Ambulatory Visit: Payer: Self-pay | Admitting: Endocrinology

## 2022-03-03 ENCOUNTER — Other Ambulatory Visit (INDEPENDENT_AMBULATORY_CARE_PROVIDER_SITE_OTHER): Payer: 59

## 2022-03-03 DIAGNOSIS — E119 Type 2 diabetes mellitus without complications: Secondary | ICD-10-CM | POA: Diagnosis not present

## 2022-03-03 DIAGNOSIS — E040 Nontoxic diffuse goiter: Secondary | ICD-10-CM

## 2022-03-03 DIAGNOSIS — E782 Mixed hyperlipidemia: Secondary | ICD-10-CM | POA: Diagnosis not present

## 2022-03-03 LAB — COMPREHENSIVE METABOLIC PANEL
ALT: 20 U/L (ref 0–35)
AST: 21 U/L (ref 0–37)
Albumin: 4.1 g/dL (ref 3.5–5.2)
Alkaline Phosphatase: 40 U/L (ref 39–117)
BUN: 15 mg/dL (ref 6–23)
CO2: 28 mEq/L (ref 19–32)
Calcium: 8.7 mg/dL (ref 8.4–10.5)
Chloride: 104 mEq/L (ref 96–112)
Creatinine, Ser: 0.99 mg/dL (ref 0.40–1.20)
GFR: 62.36 mL/min (ref >60.00)
Glucose, Bld: 106 mg/dL — ABNORMAL HIGH (ref 70–99)
Potassium: 4.1 mEq/L (ref 3.5–5.1)
Sodium: 139 mEq/L (ref 135–145)
Total Bilirubin: 0.3 mg/dL (ref 0.2–1.2)
Total Protein: 6.9 g/dL (ref 6.0–8.3)

## 2022-03-03 LAB — HEMOGLOBIN A1C: Hgb A1c MFr Bld: 6.3 % (ref 4.6–6.5)

## 2022-03-03 LAB — MICROALBUMIN / CREATININE URINE RATIO
Creatinine,U: 232.8 mg/dL
Microalb Creat Ratio: 1.2 mg/g (ref 0.0–30.0)
Microalb, Ur: 2.7 mg/dL — ABNORMAL HIGH (ref 0.0–1.9)

## 2022-03-03 LAB — LIPID PANEL
Cholesterol: 136 mg/dL (ref 0–200)
HDL: 36.6 mg/dL — ABNORMAL LOW (ref >39.00)
LDL Cholesterol: 77 mg/dL (ref 0–99)
NonHDL: 99.36
Total CHOL/HDL Ratio: 4
Triglycerides: 111 mg/dL (ref 0.0–149.0)
VLDL: 22.2 mg/dL (ref 0.0–40.0)

## 2022-03-03 LAB — T4, FREE: Free T4: 0.78 ng/dL (ref 0.60–1.60)

## 2022-03-03 LAB — TSH: TSH: 0.63 u[IU]/mL (ref 0.35–5.50)

## 2022-03-10 ENCOUNTER — Encounter: Payer: Self-pay | Admitting: Endocrinology

## 2022-03-10 ENCOUNTER — Ambulatory Visit (INDEPENDENT_AMBULATORY_CARE_PROVIDER_SITE_OTHER): Payer: 59 | Admitting: Endocrinology

## 2022-03-10 VITALS — BP 112/76 | HR 68 | Ht 68.0 in | Wt 231.2 lb

## 2022-03-10 DIAGNOSIS — E782 Mixed hyperlipidemia: Secondary | ICD-10-CM

## 2022-03-10 DIAGNOSIS — E669 Obesity, unspecified: Secondary | ICD-10-CM

## 2022-03-10 DIAGNOSIS — E049 Nontoxic goiter, unspecified: Secondary | ICD-10-CM | POA: Diagnosis not present

## 2022-03-10 DIAGNOSIS — E1169 Type 2 diabetes mellitus with other specified complication: Secondary | ICD-10-CM

## 2022-03-10 MED ORDER — GLUCOSE BLOOD VI STRP: 1.0000 | ORAL_STRIP | Freq: Two times a day (BID) | 3 refills | Status: AC

## 2022-03-10 NOTE — Patient Instructions (Addendum)
Check blood sugars on waking up 2 days a week ? ?Also check blood sugars about 2 hours after meals and do this after different meals by rotation ? ?Recommended blood sugar levels on waking up are 90-130 and about 2 hours after meal is 130-160 ? ?Please bring your blood sugar monitor to each visit, thank you ? ?Take Niaspan daily ? ?Walk daily ?

## 2022-03-10 NOTE — Progress Notes (Signed)
? ? ?Patient ID: Alicia Murray, female   DOB: 15-Nov-1960, 61 y.o.   MRN: [id number] ? ? ? ?Reason for Appointment: Endocrinology follow-up  ? ?History of Present Illness  ? ?Diagnosis: Type 2 DIABETES MELITUS, date of diagnosis:  2007    ? ?Previous history: She had significant hyperglycemia diagnosis and was treated with insulin and metformin. ?However with efforts to lose weight she was able to be treated without medications for sometime. She currently was started on metformin but because of her progressive weight gain she was switched to Victoza with good success and has been on this since 2/14. Currently detailed records are not available ? ?She was started on Ozempic in 11/2018 ? ?Recent history:  ? ?Non-insulin hypoglycemic drugs: Ozempic 2 mg weekly ? ?Current management, blood sugar patterns and problems identified: ? ?A1c is 6.3, was 6.1 ? ?She has not again checked her blood sugars again and now says that she does not have tested for her One Touch monitor ?Fasting glucose was relatively better at 106 ?She is not able to modify her diet with cutting back sweets, portions or carbohydrates.  She is eating peanut butter cups regularly ?Also she may have snacks even when she is not hungry  ?She has gained back the weight that she had lost on her last visit ?She still has not started any exercise program even though she is switching to the second shift at work ?She has been able to take her Ozempic fairly regularly except once ? ? ?Side effects from medications: None ?      ?Glucose monitoring: With One Touch ultra meter ? Marland Kitchen      No readings available  ?      ? ? ?Wt Readings from Last 3 Encounters:  ?03/10/22 231 lb 3.2 oz (104.9 kg)  ?09/09/21 224 lb (101.6 kg)  ?02/28/21 231 lb 9.6 oz (105.1 kg)  ? ? ?LABS: ? ?Lab Results  ?Component Value Date  ? HGBA1C 6.3 03/03/2022  ? HGBA1C 6.1 09/03/2021  ? HGBA1C 6.3 02/26/2021  ? ?Lab Results  ?Component Value Date  ? MICROALBUR 2.7 (H) 03/03/2022  ? [place]  77 03/03/2022  ? CREATININE 0.99 03/03/2022  ? ?Other active problems see review of systems ? ?No visits with results within 1 Week(s) from this visit.  ?Latest known visit with results is:  ?Lab on 03/03/2022  ?Component Date Value Ref Range Status  ? Free T4 03/03/2022 0.78  0.60 - 1.60 ng/dL Final  ? Comment: Specimens from patients who are undergoing biotin therapy and /or ingesting biotin supplements may contain high levels of biotin.  The higher biotin concentration in these specimens interferes with this Free T4 assay.  Specimens that contain high levels  ?of biotin may cause false high results for this Free T4 assay.  Please interpret results in light of the total clinical presentation of the patient.  ?  ? TSH 03/03/2022 0.63  0.35 - 5.50 uIU/mL Final  ? Cholesterol 03/03/2022 136  0 - 200 mg/dL Final  ? ATP III Classification       Desirable:  < 200 mg/dL               Borderline High:  200 - 239 mg/dL          High:  > = 240 mg/dL  ? Triglycerides 03/03/2022 111.0  0.0 - 149.0 mg/dL Final  ? Normal:  <150 mg/dLBorderline High:  150 - 199 mg/dL  ? HDL 03/03/2022 36.60 (L)  >  39.00 mg/dL Final  ? VLDL 03/03/2022 22.2  0.0 - 40.0 mg/dL Final  ? LDL Cholesterol 03/03/2022 77  0 - 99 mg/dL Final  ? Total CHOL/HDL Ratio 03/03/2022 4   Final  ?                Men          Women1/2 Average Risk     3.4          3.3Average Risk          5.0          4.42X Average Risk          9.6          7.13X Average Risk          15.0          11.0                      ? NonHDL 03/03/2022 99.36   Final  ? NOTE:  Non-HDL goal should be 30 mg/dL higher than patient's LDL goal (i.e. LDL goal of < 70 mg/dL, would have non-HDL goal of < 100 mg/dL)  ? Sodium 03/03/2022 139  135 - 145 mEq/L Final  ? Potassium 03/03/2022 4.1  3.5 - 5.1 mEq/L Final  ? Chloride 03/03/2022 104  96 - 112 mEq/L Final  ? CO2 03/03/2022 28  19 - 32 mEq/L Final  ? Glucose, Bld 03/03/2022 106 (H)  70 - 99 mg/dL Final  ? BUN 03/03/2022 15  6 - 23 mg/dL Final  ?  Creatinine, Ser 03/03/2022 0.99  0.40 - 1.20 mg/dL Final  ? Total Bilirubin 03/03/2022 0.3  0.2 - 1.2 mg/dL Final  ? Alkaline Phosphatase 03/03/2022 40  39 - 117 U/L Final  ? AST 03/03/2022 21  0 - 37 U/L Final  ? ALT 03/03/2022 20  0 - 35 U/L Final  ? Total Protein 03/03/2022 6.9  6.0 - 8.3 g/dL Final  ? Albumin 03/03/2022 4.1  3.5 - 5.2 g/dL Final  ? GFR 03/03/2022 62.36  >60.00 mL/min Final  ? Calculated using the CKD-EPI Creatinine Equation (2021)  ? Calcium 03/03/2022 8.7  8.4 - 10.5 mg/dL Final  ? Hgb A1c MFr Bld 03/03/2022 6.3  4.6 - 6.5 % Final  ? Glycemic Control Guidelines for People with Diabetes:Non Diabetic:  <6%Goal of Therapy: <7%Additional Action Suggested:  >8%   ? Microalb, Ur 03/03/2022 2.7 (H)  0.0 - 1.9 mg/dL Final  ? Creatinine,U 03/03/2022 232.8  mg/dL Final  ? Microalb Creat Ratio 03/03/2022 1.2  0.0 - 30.0 mg/g Final  ? ? ?Allergies as of 03/10/2022   ?No Known Allergies ?  ? ?  ?Medication List  ?  ? ?  ? Accurate as of Mar 10, 2022  1:53 PM. If you have any questions, ask your nurse or doctor.  ?  ?  ? ?  ? ?APPLE CIDER VINEGAR PO ?Take by mouth. ?  ?aspirin 325 MG tablet ?Take 325 mg by mouth daily. ?  ?atorvastatin 10 MG tablet ?Commonly known as: LIPITOR ?TAKE 1 TABLET BY MOUTH EVERY DAY ?  ?B-COMPLEX-C PO ?Take 1 tablet by mouth daily. ?  ?Biotin 10 MG Tabs ?Take 1,000 mg by mouth. Once daily ?  ?Evening Primrose Oil 1000 MG Caps ?Take by mouth. ?  ?fluticasone 50 MCG/ACT nasal spray ?Commonly known as: FLONASE ?Place 1 spray into both nostrils daily. ?  ?glucose blood test strip ?Commonly  known as: ONE TOUCH ULTRA TEST ?1 each by Other route 2 (two) times daily. Use as instructed ?  ?niacin 1000 MG CR tablet ?Commonly known as: NIASPAN ?TAKE 1 TABLET (1,000 MG TOTAL) BY MOUTH AT BEDTIME. ?  ?nystatin-triamcinolone cream ?Commonly known as: MYCOLOG II ?  ?ONE TOUCH LANCETS Misc ?1 each by Does not apply route 2 (two) times daily. ?  ?Ozempic (2 MG/DOSE) 8 MG/3ML Sopn ?Generic drug:  Semaglutide (2 MG/DOSE) ?Inject 2 mg subcutaneously once a week ?  ?Turmeric 500 MG Caps ?Take by mouth. ?  ?vitamin B-12 1000 MCG tablet ?Commonly known as: CYANOCOBALAMIN ?Take 1,000 mcg by mouth daily. ?  ?Vitamin D3 50 MCG (2000 UT) Tabs ?Take 1 tablet by mouth daily. ?  ?vitamin E 180 MG (400 UNITS) capsule ?Take 400 Units by mouth daily. ?  ? ?  ? ? ?Allergies: No Known Allergies ? ?Past Medical History:  ?Diagnosis Date  ? Bladder infection   ? Diabetes mellitus   ? Fibroid   ? H/O varicella   ? History of measles, mumps, or rubella   ? Infertility   ? Monilia infection 07/10/08  ? Thrombosed external hemorrhoid 08/15/85  ? Thyroid enlargement   ? Vagina itching 07/10/08  ? Vulvitis 08/01/08  ? ? ?Past Surgical History:  ?Procedure Laterality Date  ? BREAST BIOPSY Left 08/08/2013  ? [place]  ? ? ?Family History  ?Problem Relation Age of Onset  ? Cancer Mother   ?     Breast  ? Breast cancer Mother   ? Cancer Father   ?     Brain  ? Diabetes Father   ? Diabetes Brother   ? Hypertension Brother   ? ? ?Social History:  reports that she has quit smoking. She has never used smokeless tobacco. She reports that she does not drink alcohol and does not use drugs. ? ?Review of Systems: ? ? ?Lipids:  ?Has had diabetic dyslipidemia with increased particle number and triglycerides ? ?He is on generic Niaspan without any significant flushing or itching ?However she thinks that she is irregular with this and may be taking half the time ?However she thinks she is trying to take it twice a day even though prescription is for 1 day and may be running out of time ?Had been taking aspirin 30 minutes before niacin ? ?Also takes OTC fish oil and atorvastatin 10 mg daily ? ?LDL particle number in 2017 was 1016, prior to that had gone up to 1144 ?Her last level was 961 with normal particle size ? ?LDL is consistently below 100 with 10 mg atorvastatin and also improved since last visit possibly from weight  loss ? ?HDL is relatively lower likely from irregular regimen of Niaspan ? ?Lab Results  ?Component Value Date  ? CHOL 136 03/03/2022  ? CHOL 121 09/03/2021  ? CHOL 182 02/26/2021  ? ?Lab Results  ?Component

## 2022-03-27 ENCOUNTER — Telehealth: Payer: Self-pay

## 2022-03-27 NOTE — Telephone Encounter (Signed)
Patient called in states that she needs a letter for work stating she needs to take breaks/lunches every 2 hours due to diabetes regimen. Please advise

## 2022-03-27 NOTE — Telephone Encounter (Signed)
Ok, Thank You patient has been notified

## 2022-03-29 ENCOUNTER — Other Ambulatory Visit: Payer: Self-pay | Admitting: Endocrinology

## 2022-06-25 ENCOUNTER — Other Ambulatory Visit: Payer: Self-pay | Admitting: Endocrinology

## 2022-08-21 ENCOUNTER — Other Ambulatory Visit: Payer: Self-pay | Admitting: Nurse Practitioner

## 2022-08-21 DIAGNOSIS — Z1231 Encounter for screening mammogram for malignant neoplasm of breast: Secondary | ICD-10-CM

## 2022-09-01 ENCOUNTER — Other Ambulatory Visit (INDEPENDENT_AMBULATORY_CARE_PROVIDER_SITE_OTHER): Payer: 59

## 2022-09-01 DIAGNOSIS — E669 Obesity, unspecified: Secondary | ICD-10-CM

## 2022-09-01 DIAGNOSIS — E1169 Type 2 diabetes mellitus with other specified complication: Secondary | ICD-10-CM | POA: Diagnosis not present

## 2022-09-01 DIAGNOSIS — E782 Mixed hyperlipidemia: Secondary | ICD-10-CM

## 2022-09-01 LAB — COMPREHENSIVE METABOLIC PANEL
ALT: 22 U/L (ref 0–35)
AST: 24 U/L (ref 0–37)
Albumin: 4.3 g/dL (ref 3.5–5.2)
Alkaline Phosphatase: 38 U/L — ABNORMAL LOW (ref 39–117)
BUN: 13 mg/dL (ref 6–23)
CO2: 28 mEq/L (ref 19–32)
Calcium: 8.8 mg/dL (ref 8.4–10.5)
Chloride: 105 mEq/L (ref 96–112)
Creatinine, Ser: 0.8 mg/dL (ref 0.40–1.20)
GFR: 80.25 mL/min (ref >60.00)
Glucose, Bld: 106 mg/dL — ABNORMAL HIGH (ref 70–99)
Potassium: 3.7 mEq/L (ref 3.5–5.1)
Sodium: 139 mEq/L (ref 135–145)
Total Bilirubin: 0.3 mg/dL (ref 0.2–1.2)
Total Protein: 7.5 g/dL (ref 6.0–8.3)

## 2022-09-01 LAB — LIPID PANEL
Cholesterol: 125 mg/dL (ref 0–200)
HDL: 48.3 mg/dL (ref >39.00)
LDL Cholesterol: 64 mg/dL (ref 0–99)
NonHDL: 76.82
Total CHOL/HDL Ratio: 3
Triglycerides: 62 mg/dL (ref 0.0–149.0)
VLDL: 12.4 mg/dL (ref 0.0–40.0)

## 2022-09-01 LAB — HEMOGLOBIN A1C: Hgb A1c MFr Bld: 6.3 % (ref 4.6–6.5)

## 2022-09-08 ENCOUNTER — Ambulatory Visit (INDEPENDENT_AMBULATORY_CARE_PROVIDER_SITE_OTHER): Payer: 59 | Admitting: Endocrinology

## 2022-09-08 ENCOUNTER — Encounter: Payer: Self-pay | Admitting: Endocrinology

## 2022-09-08 VITALS — BP 144/82 | HR 76 | Ht 68.0 in | Wt 226.0 lb

## 2022-09-08 DIAGNOSIS — E1169 Type 2 diabetes mellitus with other specified complication: Secondary | ICD-10-CM | POA: Diagnosis not present

## 2022-09-08 DIAGNOSIS — E049 Nontoxic goiter, unspecified: Secondary | ICD-10-CM

## 2022-09-08 DIAGNOSIS — E782 Mixed hyperlipidemia: Secondary | ICD-10-CM | POA: Diagnosis not present

## 2022-09-08 DIAGNOSIS — E669 Obesity, unspecified: Secondary | ICD-10-CM

## 2022-09-08 NOTE — Progress Notes (Signed)
Patient ID: Alicia Murray, female   DOB: 10-Nov-1961, 61 y.o.   MRN: [id number]   Reason for Appointment: Endocrinology follow-up   History of Present Illness   Diagnosis: Type 2 DIABETES MELITUS, date of diagnosis:  2007     Previous history: She had significant hyperglycemia diagnosis and was treated with insulin and metformin. However with efforts to lose weight she was able to be treated without medications for sometime. She currently was started on metformin but because of her progressive weight gain she was switched to Victoza with good success and has been on this since 2/14. Currently detailed records are not available  She was started on Ozempic in 11/2018  Recent history:   Non-insulin hypoglycemic drugs: Ozempic 2 mg weekly  Current management, blood sugar patterns and problems identified:  A1c is 6.3  She has not again checked her blood sugars  Overall still has no motivation to monitor blood sugars, follow her diet or exercise She says that after her last visit she did start walking for exercise with a friend but because she did not want to walk alone she is not doing anything and she does not want to go to the gym because she is afraid of getting COVID Her weight is slightly better but previously had gained weight Fasting glucose was not better at 106 She is generally eating sweets and not limiting anything in her diet She has been able to take her Ozempic fairly regularly   Side effects from medications: None       Glucose monitoring: With One Touch ultra meter No readings available          Wt Readings from Last 3 Encounters:  09/08/22 226 lb (102.5 kg)  03/10/22 231 lb 3.2 oz (104.9 kg)  09/09/21 224 lb (101.6 kg)    LABS:  Lab Results  Component Value Date   HGBA1C 6.3 09/01/2022   HGBA1C 6.3 03/03/2022   HGBA1C 6.1 09/03/2021   Lab Results  Component Value Date   MICROALBUR 2.7 (H) 03/03/2022   LDLCALC 64 09/01/2022   CREATININE  0.80 09/01/2022   Other active problems see review of systems  No visits with results within 1 Week(s) from this visit.  Latest known visit with results is:  Lab on 09/01/2022  Component Date Value Ref Range Status   Cholesterol 09/01/2022 125  0 - 200 mg/dL Final   ATP III Classification       Desirable:  < 200 mg/dL               Borderline High:  200 - 239 mg/dL          High:  > = 240 mg/dL   Triglycerides 09/01/2022 62.0  0.0 - 149.0 mg/dL Final   Normal:  <150 mg/dLBorderline High:  150 - 199 mg/dL   HDL 09/01/2022 48.30  >39.00 mg/dL Final   VLDL 09/01/2022 12.4  0.0 - 40.0 mg/dL Final   LDL Cholesterol 09/01/2022 64  0 - 99 mg/dL Final   Total CHOL/HDL Ratio 09/01/2022 3   Final                  Men          Women1/2 Average Risk     3.4          3.3Average Risk          5.0          4.42X Average Risk  9.6          7.13X Average Risk          15.0          11.0                       NonHDL 09/01/2022 76.82   Final   NOTE:  Non-HDL goal should be 30 mg/dL higher than patient's LDL goal (i.e. LDL goal of < 70 mg/dL, would have non-HDL goal of < 100 mg/dL)   Sodium 09/01/2022 139  135 - 145 mEq/L Final   Potassium 09/01/2022 3.7  3.5 - 5.1 mEq/L Final   Chloride 09/01/2022 105  96 - 112 mEq/L Final   CO2 09/01/2022 28  19 - 32 mEq/L Final   Glucose, Bld 09/01/2022 106 (H)  70 - 99 mg/dL Final   BUN 09/01/2022 13  6 - 23 mg/dL Final   Creatinine, Ser 09/01/2022 0.80  0.40 - 1.20 mg/dL Final   Total Bilirubin 09/01/2022 0.3  0.2 - 1.2 mg/dL Final   Alkaline Phosphatase 09/01/2022 38 (L)  39 - 117 U/L Final   AST 09/01/2022 24  0 - 37 U/L Final   ALT 09/01/2022 22  0 - 35 U/L Final   Total Protein 09/01/2022 7.5  6.0 - 8.3 g/dL Final   Albumin 09/01/2022 4.3  3.5 - 5.2 g/dL Final   GFR 09/01/2022 80.25  >60.00 mL/min Final   Calculated using the CKD-EPI Creatinine Equation (2021)   Calcium 09/01/2022 8.8  8.4 - 10.5 mg/dL Final   Hgb A1c MFr Bld 09/01/2022 6.3  4.6 - 6.5  % Final   Glycemic Control Guidelines for People with Diabetes:Non Diabetic:  <6%Goal of Therapy: <7%Additional Action Suggested:  >8%     Allergies as of 09/08/2022   No Known Allergies      Medication List        Accurate as of September 08, 2022  1:20 PM. If you have any questions, ask your nurse or doctor.          APPLE CIDER VINEGAR PO Take by mouth.   aspirin 325 MG tablet Take 325 mg by mouth daily.   atorvastatin 10 MG tablet Commonly known as: LIPITOR TAKE 1 TABLET BY MOUTH EVERY DAY   B-COMPLEX-C PO Take 1 tablet by mouth daily.   Biotin 10 MG Tabs Take 1,000 mg by mouth. Once daily   cyanocobalamin 1000 MCG tablet Commonly known as: VITAMIN B12 Take 1,000 mcg by mouth daily.   Evening Primrose Oil 1000 MG Caps Take by mouth.   fluticasone 50 MCG/ACT nasal spray Commonly known as: FLONASE Place 1 spray into both nostrils daily.   glucose blood test strip Commonly known as: ONE TOUCH ULTRA TEST 1 each by Other route 2 (two) times daily.   niacin 1000 MG CR tablet Commonly known as: NIASPAN TAKE 1 TABLET (1,000 MG TOTAL) BY MOUTH AT BEDTIME.   nystatin-triamcinolone cream Commonly known as: MYCOLOG II   ONE TOUCH LANCETS Misc 1 each by Does not apply route 2 (two) times daily.   Ozempic (2 MG/DOSE) 8 MG/3ML Sopn Generic drug: Semaglutide (2 MG/DOSE) Inject 2 mg subcutaneously once a week   Turmeric 500 MG Caps Take by mouth.   Vitamin D3 50 MCG (2000 UT) Tabs Take 1 tablet by mouth daily.   vitamin E 180 MG (400 UNITS) capsule Take 400 Units by mouth daily.        Allergies:  No Known Allergies  Past Medical History:  Diagnosis Date   Bladder infection    Diabetes mellitus    Fibroid    H/O varicella    History of measles, mumps, or rubella    Infertility    Monilia infection 07/10/08   Thrombosed external hemorrhoid 08/15/85   Thyroid enlargement    Vagina itching 07/10/08   Vulvitis 08/01/08    Past Surgical History:   Procedure Laterality Date   BREAST BIOPSY Left 08/08/2013   MYOMECTOMY ABDOMINAL APPROACH  1977    Family History  Problem Relation Age of Onset   Cancer Mother        Breast   Breast cancer Mother    Cancer Father        Brain   Diabetes Father    Diabetes Brother    Hypertension Brother     Social History:  reports that she has quit smoking. She has never used smokeless tobacco. She reports that she does not drink alcohol and does not use drugs.  Review of Systems:  BP Readings from Last 3 Encounters:  09/08/22 (!) 144/82  03/10/22 112/76  09/09/21 124/80    Lipids:  Has had diabetic dyslipidemia with increased particle number and triglycerides  He is on generic Niaspan without any significant flushing or itching However she thinks that she is irregular with this and may be taking half the time However she thinks she is trying to take it twice a day even though prescription is for 1 day and may be running out of time Had been taking aspirin 30 minutes before niacin  Also takes OTC fish oil and atorvastatin 10 mg daily  LDL particle number in 2017 was 1016, prior to that had gone up to 1144 Her last level was 961 with normal particle size  LDL is consistently below 100 with 10 mg atorvastatin and also improved since last visit possibly from weight loss  HDL is relatively lower likely from irregular regimen of Niaspan  Lab Results  Component Value Date   CHOL 125 09/01/2022   CHOL 136 03/03/2022   CHOL 121 09/03/2021   Lab Results  Component Value Date   HDL 48.30 09/01/2022   HDL 36.60 (L) 03/03/2022   HDL 44.60 09/03/2021   Lab Results  Component Value Date   LDLCALC 64 09/01/2022   LDLCALC 77 03/03/2022   LDLCALC 58 09/03/2021   Lab Results  Component Value Date   TRIG 62.0 09/01/2022   TRIG 111.0 03/03/2022   TRIG 90.0 09/03/2021   Lab Results  Component Value Date   CHOLHDL 3 09/01/2022   CHOLHDL 4 03/03/2022   CHOLHDL 3 09/03/2021   No  results found for: "LDLDIRECT"  THYROID enlargement: This has been present since at least 2014 and CT scan did not show any nodules She has no local discomfort or swallowing difficulty  On the last exam she had a small fleshy thyroid enlargement bilaterally, almost 3 times normal on the right, but twice normal on the left and softer  Lab Results  Component Value Date   TSH 0.63 03/03/2022   TSH 0.85 02/14/2020   TSH 0.93 01/12/2018   FREET4 0.78 03/03/2022   FREET4 0.91 08/11/2013      Examination:   BP (!) 144/82   Pulse 76   Ht '5\' 8"'$  (1.727 m)   Wt 226 lb (102.5 kg)   LMP 01/04/2014   SpO2 92%   BMI 34.36 kg/m   Body mass index  is 34.36 kg/m.   Diabetic Foot Exam - Simple   No data filed      ASSESSMENT/ PLAN:   Diabetes type 2 with obesity  See history of present illness for detailed discussion of current diabetes management, blood sugar patterns and problems identified  Her A1c is still fairly good at 6.3  She is on Ozempic 2 mg weekly   She has still not been willing to lose weight As before she is not motivated to change her diet or exercise Also not monitoring blood sugars as before Currently Alicia Murray is not covered and she can check next year to see if she has coverage Discussed differences between this and the [facility] and the injection device  Encouraged her to go to the gym since she has had the COVID shot and start exercising If she has any issues with her carpal tunnel syndrome she can talk to her PCP   HYPERLIPIDEMIA:   Well controlled on atorvastatin and Niaspan HDL better with better compliance with Niaspan  Blood pressure is high normal and will need to follow, she is due to see her PCP  Follow-up in 6 months  There are no Patient Instructions on file for this visit.   Elayne Snare 09/08/2022, 1:20 PM

## 2022-09-08 NOTE — Patient Instructions (Signed)
Check coverage for [facility]  Exercise at [facility]

## 2022-09-17 ENCOUNTER — Other Ambulatory Visit: Payer: Self-pay | Admitting: Endocrinology

## 2022-10-06 ENCOUNTER — Ambulatory Visit
Admission: RE | Admit: 2022-10-06 | Discharge: 2022-10-06 | Disposition: A | Discharge status: Home / Self Care | Payer: 59 | Source: Ambulatory Visit | Attending: Nurse Practitioner | Admitting: Nurse Practitioner

## 2022-10-06 DIAGNOSIS — Z1231 Encounter for screening mammogram for malignant neoplasm of breast: Secondary | ICD-10-CM

## 2022-10-07 ENCOUNTER — Telehealth: Payer: Self-pay

## 2022-10-07 DIAGNOSIS — E1169 Type 2 diabetes mellitus with other specified complication: Secondary | ICD-10-CM

## 2022-10-07 NOTE — Telephone Encounter (Signed)
Patient called in about ozempic its on backorder and she is unable to get. Wants to try mounjaro please advise.

## 2022-10-08 MED ORDER — TIRZEPATIDE 5 MG/0.5ML ~~LOC~~ SOAJ: 5.0000 mg | SUBCUTANEOUS | 1 refills | Status: DC

## 2022-10-08 NOTE — Addendum Note (Signed)
Addended by: Cinda Quest on: 10/08/2022 02:10 PM   Modules accepted: Orders

## 2022-10-09 MED ORDER — TIRZEPATIDE 5 MG/0.5ML ~~LOC~~ SOAJ: 5.0000 mg | SUBCUTANEOUS | 1 refills | Status: DC

## 2022-10-09 NOTE — Telephone Encounter (Signed)
Prescription sent and sent phone number and website to patient via mychart for savings card

## 2022-10-09 NOTE — Addendum Note (Signed)
Addended by: Cinda Quest on: 10/09/2022 02:55 PM   Modules accepted: Orders

## 2022-11-11 ENCOUNTER — Ambulatory Visit
Admission: EM | Admit: 2022-11-11 | Discharge: 2022-11-11 | Disposition: A | Discharge status: Home / Self Care | Payer: 59 | Attending: Physician Assistant | Admitting: Physician Assistant

## 2022-11-11 DIAGNOSIS — E119 Type 2 diabetes mellitus without complications: Secondary | ICD-10-CM

## 2022-11-11 DIAGNOSIS — M25512 Pain in left shoulder: Secondary | ICD-10-CM

## 2022-11-11 MED ORDER — KETOROLAC TROMETHAMINE 30 MG/ML IJ SOLN
30.0000 mg | Freq: Once | INTRAMUSCULAR | Status: AC
  Administered 2022-11-11: 30 mg via INTRAMUSCULAR

## 2022-11-11 MED ORDER — PREDNISONE 20 MG PO TABS: 40.0000 mg | ORAL_TABLET | Freq: Every day | ORAL | 0 refills | Status: AC

## 2022-11-11 NOTE — ED Provider Notes (Signed)
[name]    CSN: [id number] Arrival date & time: 11/11/22  1555      History   Chief Complaint Chief Complaint  Patient presents with   Shoulder Pain    HPI Alicia Murray is a 62 y.o. female.   Patient here today for evaluation of left shoulder pain that started about a week ago. Pain worsens with certain movements. She states that when she lifts her left arm above shoulder height it will feel weak as if gravity pulls it back down. She had similar presentation to her right shoulder in the past but states she delayed treatment to right shoulder and it became more severe. She does work as a Development worker, community carrier and states that she has repetitive arm movements that might have caused symptoms. She has take a few doses of ibuprofen, last about 7 hours ago. She reports minimal improvement with same. She does report an injection in her hip (systemic) with right shoulder pain that was helpful. Patient is a diabetic and currently controlled with noninsulin injectable meds. She does not check her glucose regularly as she does not have typical issues with same. Last HBA1C 2 months ago was 6.3.   The history is provided by the patient.  Shoulder Pain Associated symptoms: no fever     Past Medical History:  Diagnosis Date   Bladder infection    Diabetes mellitus    Fibroid    H/O varicella    History of measles, mumps, or rubella    Infertility    Monilia infection 07/10/08   Thrombosed external hemorrhoid 08/15/85   Thyroid enlargement    Vagina itching 07/10/08   Vulvitis 08/01/08    Patient Active Problem List   Diagnosis Date Noted   Morbid obesity ([place]) 09/20/2017   Diabetes mellitus, stable ([place]) 08/15/2013   Other and unspecified hyperlipidemia 08/15/2013   Fibroid, uterine 06/09/2012   Infertility, female, primary 05/11/2012   Thyroid enlargement 05/11/2012   Irregular periods 05/11/2012    Past Surgical History:  Procedure Laterality Date   BREAST BIOPSY  Left 08/08/2013   MYOMECTOMY ABDOMINAL APPROACH  1977    OB History     Gravida  0   Para  0   Term  0   Preterm  0   AB  0   Living  0      SAB  0   IAB  0   Ectopic  0   Multiple  0   Live Births               Home Medications    Prior to Admission medications   Medication Sig Start Date End Date Taking? Authorizing Provider  predniSONE (DELTASONE) 20 MG tablet Take 2 tablets (40 mg total) by mouth daily with breakfast for 5 days. 11/11/22 11/16/22 Yes Francene Finders, PA-C  APPLE CIDER VINEGAR PO Take by mouth.    [provider]  aspirin 325 MG tablet Take 325 mg by mouth daily.    [provider]  atorvastatin (LIPITOR) 10 MG tablet TAKE 1 TABLET BY MOUTH EVERY DAY 04/02/22   Elayne Snare, MD  B-COMPLEX-C PO Take 1 tablet by mouth daily.     [provider]  Biotin 10 MG TABS Take 1,000 mg by mouth. Once daily    [provider]  Cholecalciferol (VITAMIN D3) 2000 UNITS TABS Take 1 tablet by mouth daily.     [provider]  Evening Primrose Oil 1000 MG CAPS  Take by mouth.    [provider]  fluticasone (FLONASE) 50 MCG/ACT nasal spray Place 1 spray into both nostrils daily. 06/09/18   Elayne Snare, MD  glucose blood (ONE TOUCH ULTRA TEST) test strip 1 each by Other route 2 (two) times daily. 03/10/22   Elayne Snare, MD  niacin (NIASPAN) 1000 MG CR tablet TAKE 1 TABLET (1,000 MG TOTAL) BY MOUTH AT BEDTIME. 06/25/22   Elayne Snare, MD  nystatin-triamcinolone [facility] II) cream  08/03/14   [provider]  ONE TOUCH LANCETS MISC 1 each by Does not apply route 2 (two) times daily. 09/17/16   Elayne Snare, MD  Semaglutide, 2 MG/DOSE, (OZEMPIC, 2 MG/DOSE,) 8 MG/3ML SOPN INJECT 2 MG SUBCUTANEOUSLY ONCE A WEEK 09/17/22   Elayne Snare, MD  tirzepatide [facility]) 5 MG/0.5ML Pen Inject 5 mg into the skin once a week. 10/09/22   Elayne Snare, MD  Turmeric 500 MG CAPS Take by mouth.    [provider]  vitamin B-12  (CYANOCOBALAMIN) 1000 MCG tablet Take 1,000 mcg by mouth daily.    [provider]  vitamin E 180 MG (400 UNITS) capsule Take 400 Units by mouth daily.    [provider]    Family History Family History  Problem Relation Age of Onset   Cancer Mother        Breast   Breast cancer Mother    Cancer Father        Brain   Diabetes Father    Diabetes Brother    Hypertension Brother     Social History Social History   Tobacco Use   Smoking status: Former   Smokeless tobacco: Never  Substance Use Topics   Alcohol use: No   Drug use: No     Allergies   Patient has no known allergies.   Review of Systems Review of Systems  Constitutional:  Negative for chills and fever.  Eyes:  Negative for discharge and redness.  Gastrointestinal:  Negative for abdominal pain, nausea and vomiting.  Musculoskeletal:  Positive for arthralgias.  Neurological:  Negative for numbness.     Physical Exam Triage Vital Signs ED Triage Vitals  Enc Vitals Group     BP      Pulse      Resp      Temp      Temp src      SpO2      Weight      Height      Head Circumference      Peak Flow      Pain Score      Pain Loc      Pain Edu?      Excl. in [place]?    No data found.  Updated Vital Signs BP 133/69   Pulse 69   Temp 98.2 F (36.8 C) (Oral)   Resp 17   LMP 01/04/2014   SpO2 97%      Physical Exam Vitals and nursing note reviewed.  Constitutional:      General: She is not in acute distress.    Appearance: Normal appearance. She is not ill-appearing.  HENT:     Head: Normocephalic and atraumatic.  Eyes:     Conjunctiva/sclera: Conjunctivae normal.  Cardiovascular:     Rate and Rhythm: Normal rate.  Pulmonary:     Effort: Pulmonary effort is normal. No respiratory distress.  Musculoskeletal:     Comments: Significantly decreased ROM of left shoulder in all directions due to  pain  Neurological:     Mental Status: She is alert.     Comments: Grip strength  5/5 bilaterally  Psychiatric:        Mood and Affect: Mood normal.        Behavior: Behavior normal.        Thought Content: Thought content normal.      UC Treatments / Results  Labs (all labs ordered are listed, but only abnormal results are displayed) Labs Reviewed - No data to display  EKG   Radiology No results found.  Procedures Procedures (including critical care time)  Medications Ordered in UC Medications  ketorolac (TORADOL) 30 MG/ML injection 30 mg (30 mg Intramuscular Given 11/11/22 1808)    Initial Impression / Assessment and Plan / UC Course  I have reviewed the triage vital signs and the nursing notes.  Pertinent labs & imaging results that were available during my care of the patient were reviewed by me and considered in my medical decision making (see chart for details).    Toradol injection administered in office. Will treat with short course of oral steroids and discussed this might elevated glucose and to monitor for any changes or concerns with same. Recommend follow up with ortho if no gradual improvement with treatment. Advised to avoid ibuprofen while taking steroids, after Toradol injection. Patient expresses understanding.   Final Clinical Impressions(s) / UC Diagnoses   Final diagnoses:  Acute pain of left shoulder   Discharge Instructions   None    ED Prescriptions     Medication Sig Dispense Auth. Provider   predniSONE (DELTASONE) 20 MG tablet Take 2 tablets (40 mg total) by mouth daily with breakfast for 5 days. 10 tablet Francene Finders, PA-C      PDMP not reviewed this encounter.   Francene Finders, PA-C 11/11/22 1829

## 2022-11-11 NOTE — ED Triage Notes (Signed)
Pt states she does not do heavy lifting but does a lot of repetitive lifting & moving daily.

## 2022-11-11 NOTE — ED Triage Notes (Signed)
Pt presents with left shoulder pain with certain movements X 1 week with no known injury or heavy lifting.

## 2023-02-11 ENCOUNTER — Other Ambulatory Visit: Payer: Self-pay | Admitting: Endocrinology

## 2023-03-09 ENCOUNTER — Other Ambulatory Visit (INDEPENDENT_AMBULATORY_CARE_PROVIDER_SITE_OTHER): Payer: 59

## 2023-03-09 DIAGNOSIS — E782 Mixed hyperlipidemia: Secondary | ICD-10-CM

## 2023-03-09 DIAGNOSIS — E1169 Type 2 diabetes mellitus with other specified complication: Secondary | ICD-10-CM

## 2023-03-09 DIAGNOSIS — E669 Obesity, unspecified: Secondary | ICD-10-CM | POA: Diagnosis not present

## 2023-03-09 DIAGNOSIS — E049 Nontoxic goiter, unspecified: Secondary | ICD-10-CM

## 2023-03-09 LAB — COMPREHENSIVE METABOLIC PANEL
ALT: 25 U/L (ref 0–35)
AST: 25 U/L (ref 0–37)
Albumin: 4.1 g/dL (ref 3.5–5.2)
Alkaline Phosphatase: 35 U/L — ABNORMAL LOW (ref 39–117)
BUN: 13 mg/dL (ref 6–23)
CO2: 29 mEq/L (ref 19–32)
Calcium: 8.8 mg/dL (ref 8.4–10.5)
Chloride: 104 mEq/L (ref 96–112)
Creatinine, Ser: 0.87 mg/dL (ref 0.40–1.20)
GFR: 72.3 mL/min (ref >60.00)
Glucose, Bld: 99 mg/dL (ref 70–99)
Potassium: 3.9 mEq/L (ref 3.5–5.1)
Sodium: 139 mEq/L (ref 135–145)
Total Bilirubin: 0.4 mg/dL (ref 0.2–1.2)
Total Protein: 7.2 g/dL (ref 6.0–8.3)

## 2023-03-09 LAB — TSH: TSH: 1.67 u[IU]/mL (ref 0.35–5.50)

## 2023-03-09 LAB — LIPID PANEL
Cholesterol: 145 mg/dL (ref 0–200)
HDL: 44.3 mg/dL (ref >39.00)
LDL Cholesterol: 77 mg/dL (ref 0–99)
NonHDL: 100.68
Total CHOL/HDL Ratio: 3
Triglycerides: 118 mg/dL (ref 0.0–149.0)
VLDL: 23.6 mg/dL (ref 0.0–40.0)

## 2023-03-09 LAB — HEMOGLOBIN A1C: Hgb A1c MFr Bld: 6.4 % (ref 4.6–6.5)

## 2023-03-09 LAB — MICROALBUMIN / CREATININE URINE RATIO
Creatinine,U: 213.4 mg/dL
Microalb Creat Ratio: 1.6 mg/g (ref 0.0–30.0)
Microalb, Ur: 3.4 mg/dL — ABNORMAL HIGH (ref 0.0–1.9)

## 2023-03-13 ENCOUNTER — Other Ambulatory Visit: Payer: Self-pay | Admitting: Endocrinology

## 2023-03-13 DIAGNOSIS — I10 Essential (primary) hypertension: Secondary | ICD-10-CM | POA: Insufficient documentation

## 2023-03-13 DIAGNOSIS — E1169 Type 2 diabetes mellitus with other specified complication: Secondary | ICD-10-CM

## 2023-03-16 ENCOUNTER — Ambulatory Visit: Payer: 59 | Admitting: Endocrinology

## 2023-03-16 ENCOUNTER — Encounter: Payer: Self-pay | Admitting: Endocrinology

## 2023-03-16 ENCOUNTER — Ambulatory Visit (INDEPENDENT_AMBULATORY_CARE_PROVIDER_SITE_OTHER): Payer: 59 | Admitting: Endocrinology

## 2023-03-16 VITALS — BP 120/64 | HR 71 | Ht 68.0 in | Wt 236.0 lb

## 2023-03-16 DIAGNOSIS — Z6835 Body mass index (BMI) 35.0-35.9, adult: Secondary | ICD-10-CM | POA: Diagnosis not present

## 2023-03-16 DIAGNOSIS — E119 Type 2 diabetes mellitus without complications: Secondary | ICD-10-CM | POA: Diagnosis not present

## 2023-03-16 DIAGNOSIS — E782 Mixed hyperlipidemia: Secondary | ICD-10-CM | POA: Diagnosis not present

## 2023-03-16 DIAGNOSIS — Z7985 Long-term (current) use of injectable non-insulin antidiabetic drugs: Secondary | ICD-10-CM

## 2023-03-16 MED ORDER — TIRZEPATIDE 10 MG/0.5ML ~~LOC~~ SOAJ: 10.0000 mg | SUBCUTANEOUS | 1 refills | Status: DC

## 2023-03-16 NOTE — Progress Notes (Unsigned)
Patient ID: Alicia Murray, female   DOB: 01-15-1961, 62 y.o.   MRN: [id number]   Reason for Appointment: Endocrinology follow-up   History of Present Illness   Diagnosis: Type 2 DIABETES MELITUS, date of diagnosis:  2007     Previous history: She had significant hyperglycemia diagnosis and was treated with insulin and metformin. However with efforts to lose weight she was able to be treated without medications for sometime. She currently was started on metformin but because of her progressive weight gain she was switched to Victoza with good success and has been on this since 2/14. Currently detailed records are not available  She was started on Ozempic in 11/2018  Recent history:   Non-insulin hypoglycemic drugs: Ozempic 2 mg weekly  Current management, blood sugar patterns and problems identified:  A1c is 6.4  She has not again checked her blood sugars  Overall still has no motivation to monitor blood sugars, follow her diet or exercise She says that after her last visit she did start walking for exercise with a friend but because she did not want to walk alone she is not doing anything and she does not want to go to the gym Her weight is  better but previously had gained weight Fasting glucose was better at 99 She is generally eating sweets and not limiting anything in her diet She has been able to take her Ozempic fairly regularly   Side effects from medications: None       Glucose monitoring: With One Touch ultra meter No readings available         Wt Readings from Last 3 Encounters:  03/16/23 236 lb (107 kg)  09/08/22 226 lb (102.5 kg)  03/10/22 231 lb 3.2 oz (104.9 kg)    LABS:  Lab Results  Component Value Date   HGBA1C 6.4 03/09/2023   HGBA1C 6.3 09/01/2022   HGBA1C 6.3 03/03/2022   Lab Results  Component Value Date   MICROALBUR 3.4 (H) 03/09/2023   LDLCALC 77 03/09/2023   CREATININE 0.87 03/09/2023   Other active problems see review  of systems  No visits with results within 1 Week(s) from this visit.  Latest known visit with results is:  Lab on 03/09/2023  Component Date Value Ref Range Status   TSH 03/09/2023 1.67  0.35 - 5.50 uIU/mL Final   Cholesterol 03/09/2023 145  0 - 200 mg/dL Final   ATP III Classification       Desirable:  < 200 mg/dL               Borderline High:  200 - 239 mg/dL          High:  > = 782 mg/dL   Triglycerides 95/62/1308 118.0  0.0 - 149.0 mg/dL Final   Normal:  <657 mg/dLBorderline High:  150 - 199 mg/dL   HDL 84/69/6295 28.41  >39.00 mg/dL Final   VLDL 32/44/0102 23.6  0.0 - 40.0 mg/dL Final   LDL Cholesterol 03/09/2023 77  0 - 99 mg/dL Final   Total CHOL/HDL Ratio 03/09/2023 3   Final                  Men          Women1/2 Average Risk     3.4          3.3Average Risk          5.0          4.42X Average Risk  9.6          7.13X Average Risk          15.0          11.0                       NonHDL 03/09/2023 100.68   Final   NOTE:  Non-HDL goal should be 30 mg/dL higher than patient's LDL goal (i.e. LDL goal of < 70 mg/dL, would have non-HDL goal of < 100 mg/dL)   Microalb, Ur 40/98/1191 3.4 (H)  0.0 - 1.9 mg/dL Final   Creatinine,U 47/82/9562 213.4  mg/dL Final   Microalb Creat Ratio 03/09/2023 1.6  0.0 - 30.0 mg/g Final   Sodium 03/09/2023 139  135 - 145 mEq/L Final   Potassium 03/09/2023 3.9  3.5 - 5.1 mEq/L Final   Chloride 03/09/2023 104  96 - 112 mEq/L Final   CO2 03/09/2023 29  19 - 32 mEq/L Final   Glucose, Bld 03/09/2023 99  70 - 99 mg/dL Final   BUN 13/06/6577 13  6 - 23 mg/dL Final   Creatinine, Ser 03/09/2023 0.87  0.40 - 1.20 mg/dL Final   Total Bilirubin 03/09/2023 0.4  0.2 - 1.2 mg/dL Final   Alkaline Phosphatase 03/09/2023 35 (L)  39 - 117 U/L Final   AST 03/09/2023 25  0 - 37 U/L Final   ALT 03/09/2023 25  0 - 35 U/L Final   Total Protein 03/09/2023 7.2  6.0 - 8.3 g/dL Final   Albumin 46/96/2952 4.1  3.5 - 5.2 g/dL Final   GFR 84/13/2440 72.30  >60.00 mL/min  Final   Calculated using the CKD-EPI Creatinine Equation (2021)   Calcium 03/09/2023 8.8  8.4 - 10.5 mg/dL Final   Hgb N0U MFr Bld 03/09/2023 6.4  4.6 - 6.5 % Final   Glycemic Control Guidelines for People with Diabetes:Non Diabetic:  <6%Goal of Therapy: <7%Additional Action Suggested:  >8%     Allergies as of 03/16/2023   No Known Allergies      Medication List        Accurate as of Mar 16, 2023  3:13 PM. If you have any questions, ask your nurse or doctor.          APPLE CIDER VINEGAR PO Take by mouth.   aspirin 325 MG tablet Take 325 mg by mouth daily.   atorvastatin 10 MG tablet Commonly known as: LIPITOR TAKE 1 TABLET BY MOUTH EVERY DAY   B-COMPLEX-C PO Take 1 tablet by mouth daily.   Biotin 10 MG Tabs Take 1,000 mg by mouth. Once daily   cyanocobalamin 1000 MCG tablet Commonly known as: VITAMIN B12 Take 1,000 mcg by mouth daily.   Evening Primrose Oil 1000 MG Caps Take by mouth.   fluticasone 50 MCG/ACT nasal spray Commonly known as: FLONASE Place 1 spray into both nostrils daily.   glucose blood test strip Commonly known as: ONE TOUCH ULTRA TEST 1 each by Other route 2 (two) times daily.   Mounjaro 5 MG/0.5ML Pen Generic drug: tirzepatide INJECT 5 MG SUBCUTANEOUSLY WEEKLY   niacin 1000 MG CR tablet Commonly known as: NIASPAN TAKE 1 TABLET (1,000 MG TOTAL) BY MOUTH AT BEDTIME.   nystatin-triamcinolone cream Commonly known as: MYCOLOG II   ONE TOUCH LANCETS Misc 1 each by Does not apply route 2 (two) times daily.   Ozempic (2 MG/DOSE) 8 MG/3ML Sopn Generic drug: Semaglutide (2 MG/DOSE) INJECT 2 MG SUBCUTANEOUSLY ONCE A WEEK  Turmeric 500 MG Caps Take by mouth.   Vitamin D3 50 MCG (2000 UT) Tabs Take 1 tablet by mouth daily.   vitamin E 180 MG (400 UNITS) capsule Take 400 Units by mouth daily.        Allergies: No Known Allergies  Past Medical History:  Diagnosis Date   Bladder infection    Diabetes mellitus    Fibroid     H/O varicella    History of measles, mumps, or rubella    Infertility    Monilia infection 07/10/08   Thrombosed external hemorrhoid 08/15/85   Thyroid enlargement    Vagina itching 07/10/08   Vulvitis 08/01/08    Past Surgical History:  Procedure Laterality Date   BREAST BIOPSY Left 08/08/2013   MYOMECTOMY ABDOMINAL APPROACH  1977    Family History  Problem Relation Age of Onset   Cancer Mother        Breast   Breast cancer Mother    Cancer Father        Brain   Diabetes Father    Diabetes Brother    Hypertension Brother     Social History:  reports that she has quit smoking. She has never used smokeless tobacco. She reports that she does not drink alcohol and does not use drugs.  Review of Systems:  BP Readings from Last 3 Encounters:  03/16/23 120/64  11/11/22 133/69  09/08/22 (!) 144/82    Lipids:  Has had diabetic dyslipidemia with increased particle number and triglycerides  He is on generic Niaspan without any significant flushing or itching However she thinks that she is irregular with this and may be taking half the time However she thinks she is trying to take it twice a day even though prescription is for 1 day and may be running out of time Had been taking aspirin 30 minutes before niacin  Also takes OTC fish oil and atorvastatin 10 mg daily  LDL particle number in 2017 was 1016, prior to that had gone up to 1144 Her last level was 961 with normal particle size  LDL is consistently below 454 with 10 mg atorvastatin and also improved since last visit possibly from weight loss  HDL is relatively lower likely from irregular regimen of Niaspan  Lab Results  Component Value Date   CHOL 145 03/09/2023   CHOL 125 09/01/2022   CHOL 136 03/03/2022   Lab Results  Component Value Date   HDL 44.30 03/09/2023   HDL 48.30 09/01/2022   HDL 36.60 (L) 03/03/2022   Lab Results  Component Value Date   LDLCALC 77 03/09/2023   LDLCALC 64 09/01/2022   LDLCALC  77 03/03/2022   Lab Results  Component Value Date   TRIG 118.0 03/09/2023   TRIG 62.0 09/01/2022   TRIG 111.0 03/03/2022   Lab Results  Component Value Date   CHOLHDL 3 03/09/2023   CHOLHDL 3 09/01/2022   CHOLHDL 4 03/03/2022   No results found for: "LDLDIRECT"  THYROID enlargement: This has been present since at least 2014 and CT scan did not show any nodules She has no local discomfort or swallowing difficulty  On the last exam she had a small fleshy thyroid enlargement bilaterally, almost 3 times normal on the right, but twice normal on the left and softer  Lab Results  Component Value Date   TSH 1.67 03/09/2023   TSH 0.63 03/03/2022   TSH 0.85 02/14/2020   FREET4 0.78 03/03/2022   FREET4 0.91 08/11/2013  Examination:   BP 120/64 (BP Location: Left Arm, Patient Position: Sitting, Cuff Size: Normal)   Pulse 71   Ht 5\' 8"  (1.727 m)   Wt 236 lb (107 kg)   LMP 01/04/2014   SpO2 96%   BMI 35.88 kg/m   Body mass index is 35.88 kg/m.     ASSESSMENT/ PLAN:   Diabetes type 2 with obesity  See history of present illness for detailed discussion of current diabetes management, blood sugar patterns and problems identified  Her A1c is still fairly good at 6.3  She is on Ozempic 2 mg weekly   She has still not been willing to lose weight As before she is not motivated to change her diet or exercise Also not monitoring blood sugars as before Currently Gregg[employer] Keen is not covered and she can check next year to see if she has coverage Discussed differences between this and the [facility] and the injection device  Encouraged her to go to the gym since she has had the COVID shot and start exercising If she has any issues with her carpal tunnel syndrome she can talk to her PCP   HYPERLIPIDEMIA:   Well controlled on atorvastatin and Niaspan HDL better with better compliance with Niaspan  Blood pressure is high normal and will need to follow, she is due to see her  PCP  Follow-up in 6 months  There are no Patient Instructions on file for this visit.   Reather Littler 03/16/2023, 3:13 PM

## 2023-03-16 NOTE — Patient Instructions (Addendum)
Exercise daily  Call PCP for depression

## 2023-07-04 IMAGING — MG MM DIGITAL SCREENING BILAT W/ TOMO AND CAD
6 of 10 series · 6 of 30 positions shown · non-contrast
Comparison: Previous exam(s).

CLINICAL DATA: Screening.

EXAM:
DIGITAL SCREENING BILATERAL MAMMOGRAM WITH TOMOSYNTHESIS AND CAD
TECHNIQUE: Bilateral screening digital craniocaudal and mediolateral oblique
mammograms were obtained. Bilateral screening digital breast
tomosynthesis was performed. The images were evaluated with
computer-aided detection.

[R CC synth-2D (1 of 2)]
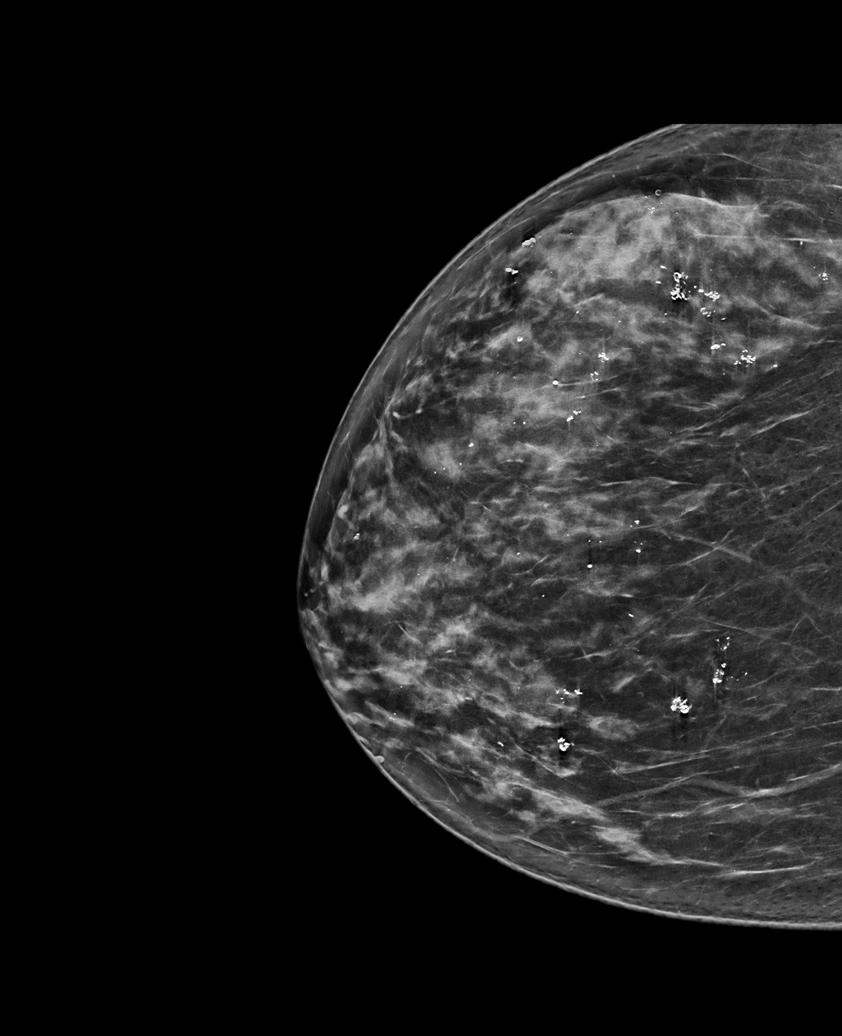

[L MLO synth-2D]
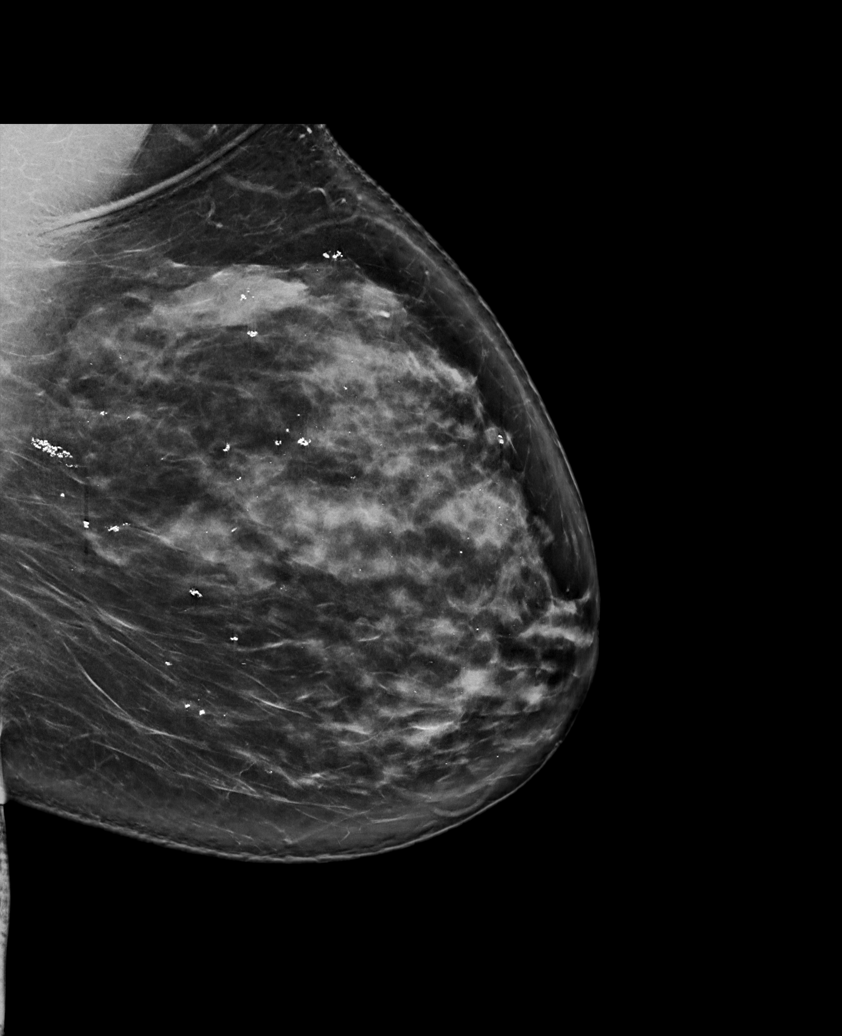

[L CC synth-2D]
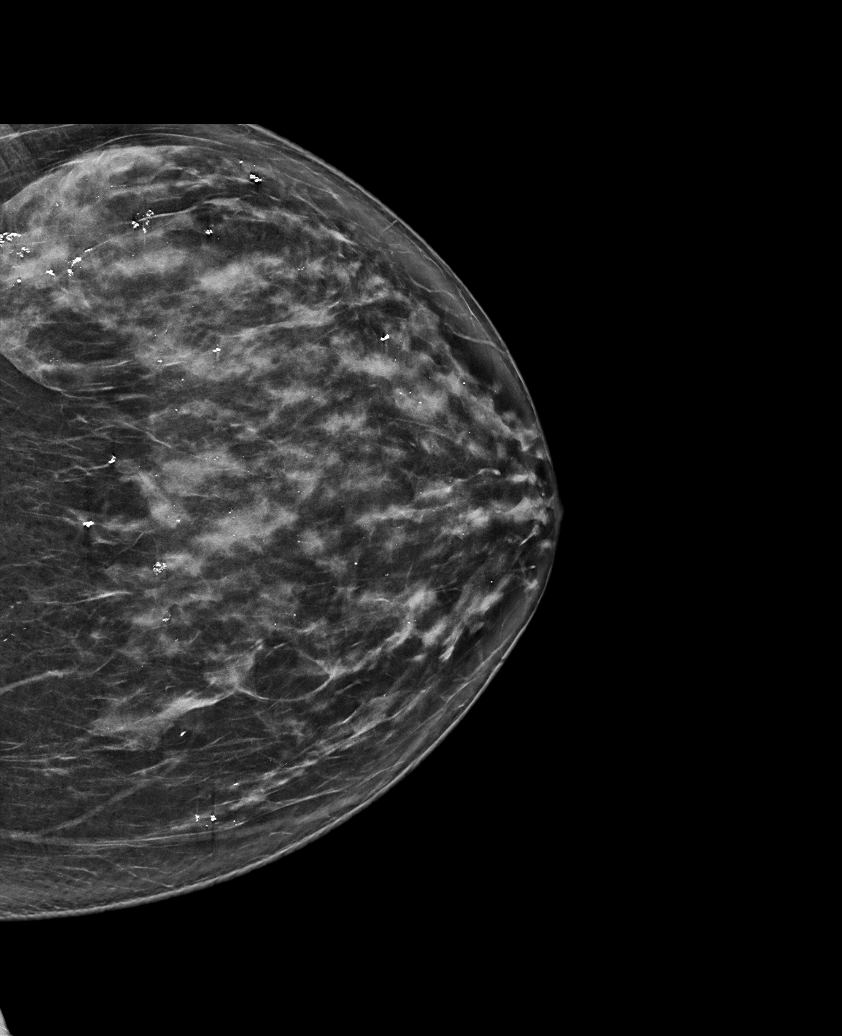

[R CC synth-2D (2 of 2)]
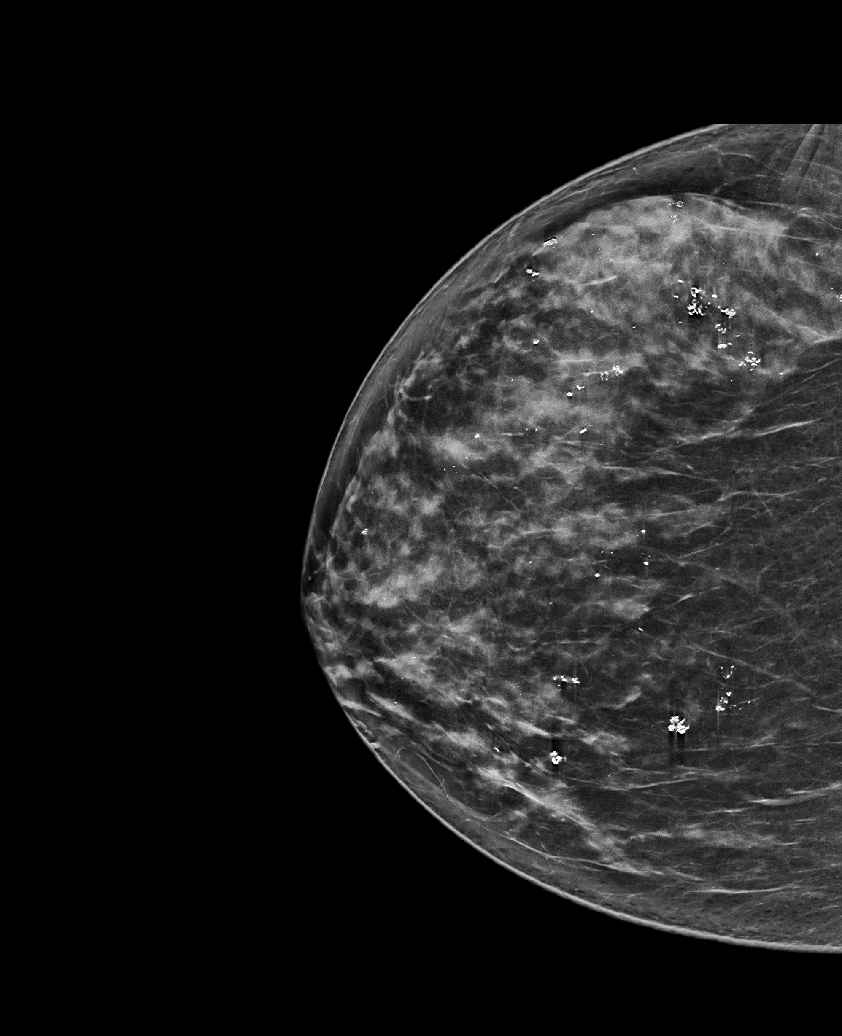

[R MLO synth-2D]
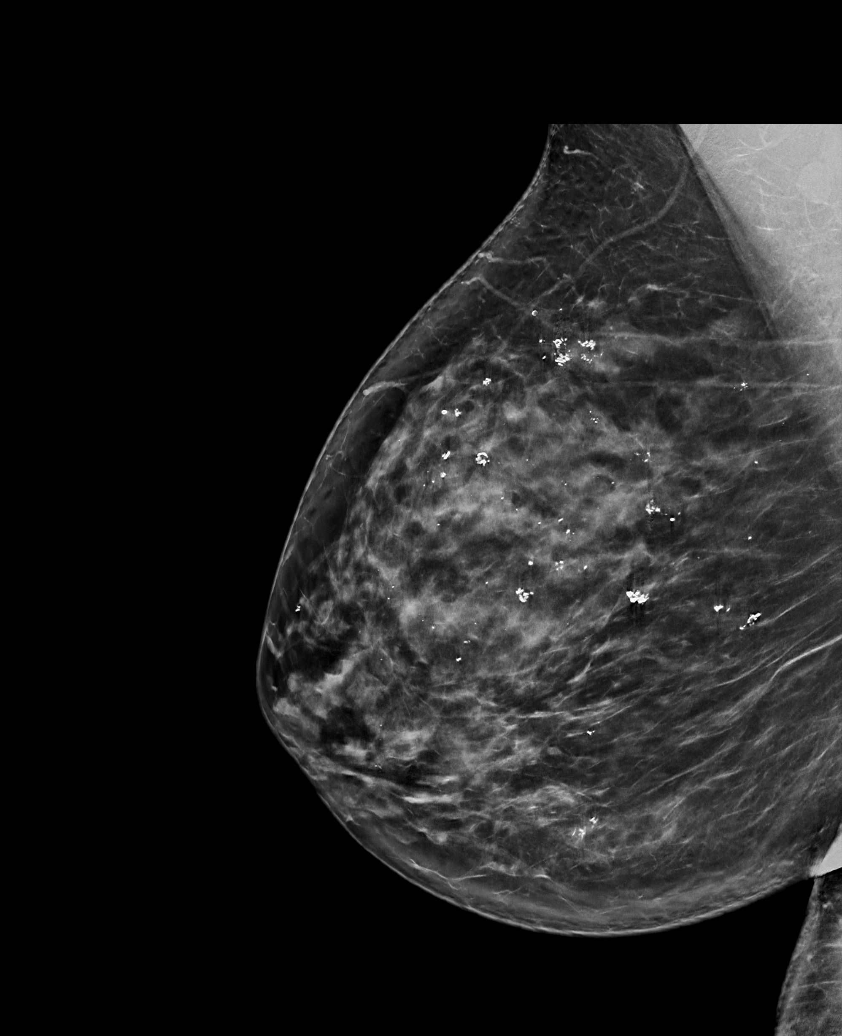

[R MLO tomo · tomo slice 45/90.0]
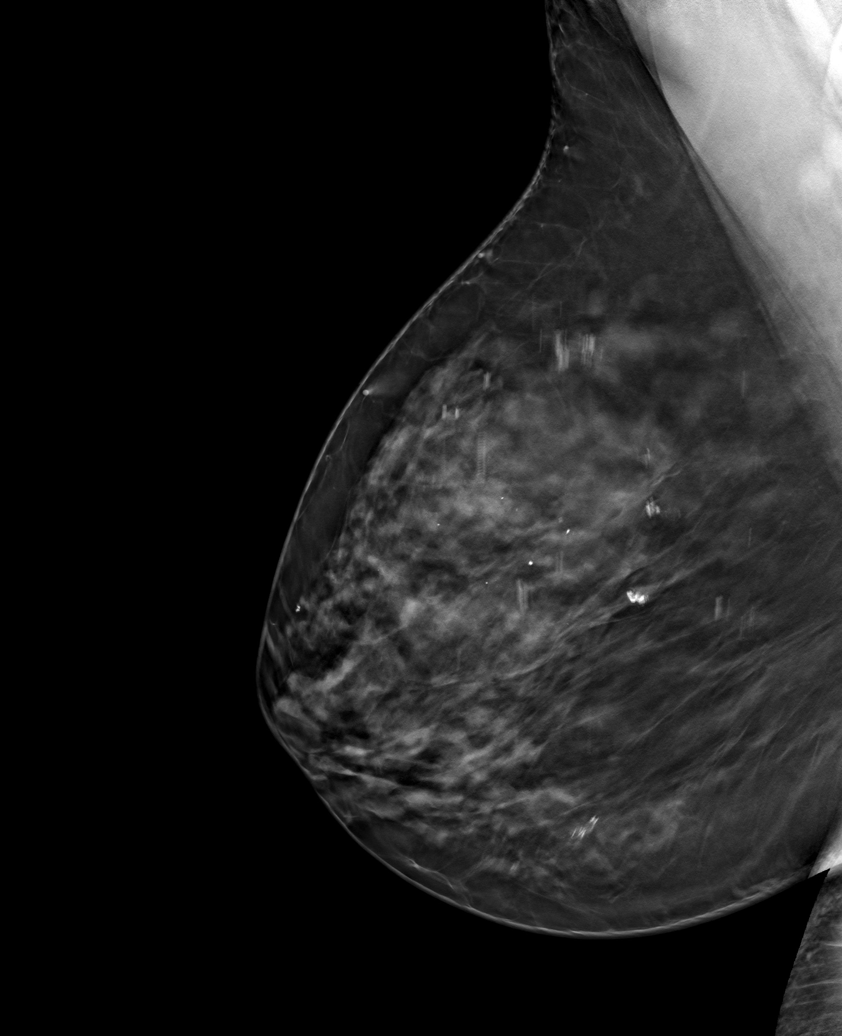

[6 of 30 positions shown; findings below may reference images not displayed]

ACR Breast Density Category c: The breast tissue is heterogeneously
dense, which may obscure small masses.
FINDINGS: There are no findings suspicious for malignancy.
IMPRESSION: No mammographic evidence of malignancy. A result letter of this
screening mammogram will be mailed directly to the patient.

RECOMMENDATION:
Screening mammogram in one year. (Code:Q3-W-BC3)

BI-RADS CATEGORY  1: Negative.

## 2023-09-16 ENCOUNTER — Other Ambulatory Visit: Payer: Self-pay

## 2023-09-16 DIAGNOSIS — E119 Type 2 diabetes mellitus without complications: Secondary | ICD-10-CM

## 2023-09-16 MED ORDER — TIRZEPATIDE 10 MG/0.5ML ~~LOC~~ SOAJ: 10.0000 mg | SUBCUTANEOUS | 1 refills | Status: DC

## 2023-12-14 ENCOUNTER — Other Ambulatory Visit: Payer: Self-pay | Admitting: Nurse Practitioner

## 2023-12-14 DIAGNOSIS — Z1231 Encounter for screening mammogram for malignant neoplasm of breast: Secondary | ICD-10-CM

## 2024-01-01 LAB — HM COLONOSCOPY

## 2024-01-04 ENCOUNTER — Ambulatory Visit
Admission: RE | Admit: 2024-01-04 | Discharge: 2024-01-04 | Disposition: A | Discharge status: Home / Self Care | Payer: 59 | Source: Ambulatory Visit | Attending: Nurse Practitioner | Admitting: Nurse Practitioner

## 2024-01-04 DIAGNOSIS — Z1231 Encounter for screening mammogram for malignant neoplasm of breast: Secondary | ICD-10-CM

## 2024-01-05 ENCOUNTER — Encounter: Payer: Self-pay | Admitting: Gastroenterology

## 2024-03-01 ENCOUNTER — Other Ambulatory Visit: Payer: Self-pay

## 2024-03-01 ENCOUNTER — Telehealth: Payer: Self-pay | Admitting: Endocrinology

## 2024-03-01 DIAGNOSIS — E782 Mixed hyperlipidemia: Secondary | ICD-10-CM

## 2024-03-01 MED ORDER — NIACIN ER (ANTIHYPERLIPIDEMIC) 1000 MG PO TBCR: 1000.0000 mg | EXTENDED_RELEASE_TABLET | Freq: Every day | ORAL | 0 refills | Status: DC

## 2024-03-01 NOTE — Telephone Encounter (Signed)
 MEDICATION:  niacin  niacin  (NIASPAN ) 1000 MG CR tablet  PHARMACY:    [id number] - [place], [place] - [address] AT [address] (Ph: [phone])    HAS THE PATIENT CONTACTED THEIR PHARMACY?  Yes  IS THIS A 90 DAY SUPPLY : Yes  IS PATIENT OUT OF MEDICATION: Yes  IF NOT; HOW MUCH IS LEFT:   LAST APPOINTMENT DATE: @5 /04/2023  NEXT APPOINTMENT DATE:@6 /16/2025  DO WE HAVE YOUR PERMISSION TO LEAVE A DETAILED MESSAGE?: Yes  OTHER COMMENTS:    **Let patient know to contact pharmacy at the end of the day to make sure medication is ready. **  ** Please notify patient to allow 48-72 hours to process**  **Encourage patient to contact the pharmacy for refills or they can request refills through [facility]**

## 2024-03-04 ENCOUNTER — Ambulatory Visit
Admission: EM | Admit: 2024-03-04 | Discharge: 2024-03-04 | Disposition: A | Discharge status: Home / Self Care | Attending: Family Medicine | Admitting: Family Medicine

## 2024-03-04 DIAGNOSIS — K5904 Chronic idiopathic constipation: Secondary | ICD-10-CM | POA: Insufficient documentation

## 2024-03-04 DIAGNOSIS — M25522 Pain in left elbow: Secondary | ICD-10-CM | POA: Diagnosis not present

## 2024-03-04 DIAGNOSIS — E782 Mixed hyperlipidemia: Secondary | ICD-10-CM | POA: Insufficient documentation

## 2024-03-04 MED ORDER — METHYLPREDNISOLONE ACETATE 80 MG/ML IJ SUSP
80.0000 mg | Freq: Once | INTRAMUSCULAR | Status: AC
  Administered 2024-03-04: 80 mg via INTRAMUSCULAR

## 2024-03-04 NOTE — ED Provider Notes (Signed)
 [name]    CSN: [id number] Arrival date & time: 03/04/24  1347      History   Chief Complaint Chief Complaint  Patient presents with   Elbow Pain    HPI Alicia Murray is a 63 y.o. female.   Patient is here for left elbow pain.  Started yesterday when she woke up.  She has decreased rom to the elbow.  No known injury.  She works at the [employer], Lobbyist.  Pain is at the lateral part of the elbow down the arm;  pain with flexion and motion of the wrist.  She did take a "pain pill" before coming which was helpful.       Past Medical History:  Diagnosis Date   Bladder infection    Diabetes mellitus    Fibroid    H/O varicella    History of measles, mumps, or rubella    Infertility    Monilia infection 07/10/08   Thrombosed external hemorrhoid 08/15/85   Thyroid  enlargement    Vagina itching 07/10/08   Vulvitis 08/01/08    Patient Active Problem List   Diagnosis Date Noted   Chronic idiopathic constipation 03/04/2024   Mixed hyperlipidemia 03/04/2024   Hypertensive disorder 03/13/2023   Morbid obesity (HCC) 09/20/2017   Diabetes mellitus, stable (HCC) 08/15/2013   Other and unspecified hyperlipidemia 08/15/2013   Fibroid, uterine 06/09/2012   Infertility, female, primary 05/11/2012   Thyroid  enlargement 05/11/2012   Irregular periods 05/11/2012    Past Surgical History:  Procedure Laterality Date   BREAST BIOPSY Left 08/08/2013   MYOMECTOMY ABDOMINAL APPROACH  1977    OB History     Gravida  0   Para  0   Term  0   Preterm  0   AB  0   Living  0      SAB  0   IAB  0   Ectopic  0   Multiple  0   Live Births               Home Medications    Prior to Admission medications   Medication Sig Start Date End Date Taking? Authorizing Provider  APPLE CIDER VINEGAR PO Take by mouth.   Yes [provider]  aspirin 325 MG tablet Take 325 mg by mouth daily.   Yes [provider]   atorvastatin  (LIPITOR) 10 MG tablet TAKE 1 TABLET BY MOUTH EVERY DAY 04/02/22  Yes Lajean Pike, MD  B Complex Vitamins (VITAMIN B COMPLEX PO) Vitamin B complex   Yes [provider]  Biotin 10 MG TABS Take 1,000 mg by mouth. Once daily   Yes [provider]  Cholecalciferol (VITAMIN D-3 PO)    Yes [provider]  doxycycline (VIBRAMYCIN) 100 MG capsule Take 100 mg by mouth daily. 02/20/24  Yes [provider]  fluticasone  (FLONASE ) 50 MCG/ACT nasal spray Place 1 spray into both nostrils daily. 06/09/18  Yes Lajean Pike, MD  GAVILYTE-G 236 g solution as directed. 12/21/23  Yes [provider]  Multiple Vitamin (MULTIVITAMIN ADULT PO)    Yes [provider]  Multiple Vitamins-Minerals (B COMPLEX PLUS VITAMIN C PO)    Yes [provider]  niacin  (NIASPAN ) 1000 MG CR tablet Take 1 tablet (1,000 mg total) by mouth at bedtime. 03/01/24  Yes Thapa, [place], MD  tirzepatide  (MOUNJARO ) 10 MG/0.5ML Pen Inject 10 mg into the skin once a week. 09/16/23  Yes Thapa, [place], MD  Turmeric 500 MG CAPS Take by mouth.   Yes [provider]  vitamin B-12 (CYANOCOBALAMIN) 1000 MCG tablet Take 1,000 mcg by mouth daily.   Yes [provider]  vitamin E 180 MG (400 UNITS) capsule Take 400 Units by mouth daily.   Yes [provider]  B-COMPLEX-C PO Take 1 tablet by mouth daily.     [provider]  Cholecalciferol (VITAMIN D3) 2000 UNITS TABS Take 1 tablet by mouth daily.     [provider]  Cranberry-Vit C-Lactobacillus (RA CRANBERRY SUPPLEMENTS PO) Cranberry supplement    [provider]  Evening Primrose Oil 1000 MG CAPS Take by mouth.    [provider]  glucose blood (ONE TOUCH ULTRA TEST) test strip 1 each by Other route 2 (two) times daily. 03/10/22   Lajean Pike, MD  nystatin-triamcinolone [facility] II) cream  08/03/14   [provider]  ONE TOUCH LANCETS MISC 1 each by Does not apply  route 2 (two) times daily. 09/17/16   Lajean Pike, MD  predniSONE  (DELTASONE ) 10 MG tablet Take 10 mg by mouth 2 (two) times daily with a meal.    [provider]    Family History Family History  Problem Relation Age of Onset   Cancer Mother        Breast   Breast cancer Mother    Cancer Father        Brain   Diabetes Father    Diabetes Brother    Hypertension Brother     Social History Social History   Tobacco Use   Smoking status: Former    Types: Cigarettes   Smokeless tobacco: Never  Vaping Use   Vaping status: Never Used  Substance Use Topics   Alcohol use: Not Currently   Drug use: Never     Allergies   Patient has no known allergies.   Review of Systems Review of Systems  Constitutional: Negative.   HENT: Negative.    Respiratory: Negative.    Cardiovascular: Negative.   Gastrointestinal: Negative.   Genitourinary: Negative.   Musculoskeletal:  Positive for arthralgias.     Physical Exam Triage Vital Signs ED Triage Vitals  Encounter Vitals Group     BP 03/04/24 1404 121/79     Systolic BP Percentile --      Diastolic BP Percentile --      Pulse Rate 03/04/24 1404 70     Resp 03/04/24 1404 20     Temp 03/04/24 1404 98.1 F (36.7 C)     Temp Source 03/04/24 1404 Oral     SpO2 03/04/24 1404 95 %     Weight 03/04/24 1358 230 lb (104.3 kg)     Height 03/04/24 1358 5\' 8"  (1.727 m)     Head Circumference --      Peak Flow --      Pain Score 03/04/24 1356 0     Pain Loc --      Pain Education --      Exclude from Growth Chart --    No data found.  Updated Vital Signs BP 121/79 (BP Location: Left Arm)   Pulse 70   Temp 98.1 F (36.7 C) (Oral)   Resp 20   Ht 5\' 8"  (1.727 m)   Wt 104.3 kg   LMP 01/04/2014   SpO2 95%   BMI 34.97 kg/m   Visual Acuity Right Eye Distance:   Left Eye Distance:   Bilateral Distance:    Right Eye  Near:   Left Eye Near:    Bilateral Near:     Physical Exam Constitutional:      Appearance:  Normal appearance. She is normal weight.  Cardiovascular:     Rate and Rhythm: Normal rate and regular rhythm.  Pulmonary:     Breath sounds: Normal breath sounds.  Musculoskeletal:     Comments: No swelling or abnormality of the left elbow;  She does not have any TTP to the left elbow today  Has pain and limited rom to the left elbow;  Pain with making a fist;  pain with resisted extension of the wrist and pronation  Skin:    General: Skin is warm.  Neurological:     General: No focal deficit present.     Mental Status: She is alert.  Psychiatric:        Mood and Affect: Mood normal.      UC Treatments / Results  Labs (all labs ordered are listed, but only abnormal results are displayed) Labs Reviewed - No data to display  EKG   Radiology No results found.  Procedures Procedures (including critical care time)  Medications Ordered in UC Medications  methylPREDNISolone acetate (DEPO-MEDROL) injection 80 mg (has no administration in time range)    Initial Impression / Assessment and Plan / UC Course  I have reviewed the triage vital signs and the nursing notes.  Pertinent labs & imaging results that were available during my care of the patient were reviewed by me and considered in my medical decision making (see chart for details).  Final Clinical Impressions(s) / UC Diagnoses   Final diagnoses:  Left elbow pain     Discharge Instructions      You were seen today for left elbow pain.  I have given you a shot of steroid while here, and a brace.  You may continue motrin /tylenol  for pain.  I recommend you ice the elbow as well.  If you are not improving please follow up with your primary care provider for further care and discussion.     ED Prescriptions   None    PDMP not reviewed this encounter.   Lesle Ras, MD 03/04/24 1425

## 2024-03-04 NOTE — ED Triage Notes (Signed)
"  My left elbow is hurting, I was seen this time last year for the same thing". "My ROM is off due to this pain, ? Swelling". No injury known.

## 2024-03-04 NOTE — Discharge Instructions (Signed)
 You were seen today for left elbow pain.  I have given you a shot of steroid while here, and a brace.  You may continue motrin /tylenol  for pain.  I recommend you ice the elbow as well.  If you are not improving please follow up with your primary care provider for further care and discussion.

## 2024-03-16 ENCOUNTER — Other Ambulatory Visit: Payer: Self-pay | Admitting: Endocrinology

## 2024-03-16 DIAGNOSIS — E119 Type 2 diabetes mellitus without complications: Secondary | ICD-10-CM

## 2024-03-25 ENCOUNTER — Other Ambulatory Visit: Payer: Self-pay | Admitting: Endocrinology

## 2024-03-25 DIAGNOSIS — E782 Mixed hyperlipidemia: Secondary | ICD-10-CM

## 2024-04-25 ENCOUNTER — Encounter: Payer: Self-pay | Admitting: Endocrinology

## 2024-04-25 ENCOUNTER — Ambulatory Visit: Payer: Self-pay | Admitting: Endocrinology

## 2024-04-25 ENCOUNTER — Ambulatory Visit (INDEPENDENT_AMBULATORY_CARE_PROVIDER_SITE_OTHER): Admitting: Endocrinology

## 2024-04-25 VITALS — BP 104/70 | Ht 68.0 in | Wt 223.0 lb

## 2024-04-25 DIAGNOSIS — Z7985 Long-term (current) use of injectable non-insulin antidiabetic drugs: Secondary | ICD-10-CM

## 2024-04-25 DIAGNOSIS — E782 Mixed hyperlipidemia: Secondary | ICD-10-CM

## 2024-04-25 DIAGNOSIS — E010 Iodine-deficiency related diffuse (endemic) goiter: Secondary | ICD-10-CM

## 2024-04-25 DIAGNOSIS — E119 Type 2 diabetes mellitus without complications: Secondary | ICD-10-CM

## 2024-04-25 LAB — POCT GLYCOSYLATED HEMOGLOBIN (HGB A1C): Hemoglobin A1C: 5.9 % — AB (ref 4.0–5.6)

## 2024-04-25 MED ORDER — TIRZEPATIDE 12.5 MG/0.5ML ~~LOC~~ SOAJ: 12.5000 mg | SUBCUTANEOUS | 4 refills | Status: DC

## 2024-04-25 MED ORDER — ATORVASTATIN CALCIUM 10 MG PO TABS: 10.0000 mg | ORAL_TABLET | Freq: Every day | ORAL | 3 refills | Status: AC

## 2024-04-25 NOTE — Patient Instructions (Signed)
 Latest Reference Range & Units 09/01/22 11:32 03/09/23 11:51 04/25/24 14:02  Hemoglobin A1C 4.0 - 5.6 % 6.3 6.4 5.9 !  !: Data is abnormal

## 2024-04-25 NOTE — Progress Notes (Signed)
 Outpatient Endocrinology Note Alicia Margretta Zamorano, MD   Patient's Name: Alicia Murray    DOB: September 17, 1961    MRN: [id number]                                                    REASON OF VISIT: Follow up for type 2 diabetes mellitus  PCP: Bari Boos, FNP  HISTORY OF PRESENT ILLNESS:   Alicia Murray is a 63 y.o. old female with past medical history listed below, is here for follow up for type 2 diabetes mellitus.   Pertinent Diabetes History: Patient was previously and last time seen by Dr. Hubert Madden in May 2024.  Patient was diagnosed with type 2 diabetes mellitus in 2007.  She was initially treated with insulin  and metformin.  GLP-1 receptor agonist started in 2014, was started initially with Victoza , she was on Ozempic  started in 2020 and later switched to Mounjaro .  No personal history of pancreatitis and / or family history of medullary thyroid  carcinoma or MEN 2B syndrome.   Chronic Diabetes Complications : Retinopathy: no. Last ophthalmology exam was done on annually, following with ophthalmology regularly.  Nephropathy: no Peripheral neuropathy: no Coronary artery disease: no Stroke: no  Relevant comorbidities and cardiovascular risk factors: Obesity: yes Body mass index is 33.91 kg/m.  Hypertension: Yes  Hyperlipidemia : Yes, on statin   Current / Home Diabetic regimen includes:  Mounjaro  10 mg weekly.   Prior diabetic medications: Ozempic , Victoza , metformin and insulin  in the past.  Glycemic data:   She has not been monitoring blood sugar at home.  Hypoglycemia: Patient has no hypoglycemic episodes. Patient has hypoglycemia awareness.  Factors modifying glucose control: 1.  Diabetic diet assessment: 3 meals a day.  2.  Staying active or exercising: No formal exercise.  3.  Medication compliance: compliant all of the time.  # Thyromegaly : Patient had CT neck for evaluation of neck stiffness seen October 2015 showed thyromegaly without dominant  thyroid  nodule.  No ultrasound thyroid  records available to review.  Patient has no neck compressive symptoms.  She is euthyroid not on thyroid  medication.  It was being monitored conservatively /clinically in the past.  Interval history  Patient presented for the follow-up.  She was last seen in this clinic by Dr. Hubert Madden in May 2024.  Diabetes regimen is reviewed and noted above.  She is on Mounjaro , denies any GI issues.  She does not check blood sugar at home.  Hemoglobin A1c today 5.9%.  She denies any neck compressive symptoms.  No hypo and hyperthyroid symptoms.  No numbness and tingling of the feet.  No vision problem.  No other complaints today.  She lost about 15 pounds of weight in last 1 year.  REVIEW OF SYSTEMS As per history of present illness.   PAST MEDICAL HISTORY: Past Medical History:  Diagnosis Date   Bladder infection    Diabetes mellitus    Fibroid    H/O varicella    History of measles, mumps, or rubella    Infertility    Monilia infection 07/10/08   Thrombosed external hemorrhoid 08/15/85   Thyroid  enlargement    Vagina itching 07/10/08   Vulvitis 08/01/08    PAST SURGICAL HISTORY: Past Surgical History:  Procedure Laterality Date   BREAST BIOPSY Left 08/08/2013   MYOMECTOMY ABDOMINAL APPROACH  1977  ALLERGIES: No Known Allergies  FAMILY HISTORY:  Family History  Problem Relation Age of Onset   Cancer Mother        Breast   Breast cancer Mother    Cancer Father        Brain   Diabetes Father    Diabetes Brother    Hypertension Brother     SOCIAL HISTORY: Social History   Socioeconomic History   Marital status: Married    Spouse name: Not on file   Number of children: Not on file   Years of education: Not on file   Highest education level: Not on file  Occupational History   Not on file  Tobacco Use   Smoking status: Former    Types: Cigarettes   Smokeless tobacco: Never  Vaping Use   Vaping status: Never Used  Substance and Sexual  Activity   Alcohol use: Not Currently   Drug use: Never   Sexual activity: Not Currently    Birth control/protection: None  Other Topics Concern   Not on file  Social History Narrative   Not on file   Social Drivers of Health   Financial Resource Strain: Not on file  Food Insecurity: Not on file  Transportation Needs: Not on file  Physical Activity: Not on file  Stress: Not on file  Social Connections: Not on file    MEDICATIONS:  Current Outpatient Medications  Medication Sig Dispense Refill   APPLE CIDER VINEGAR PO Take by mouth.     aspirin 325 MG tablet Take 325 mg by mouth daily.     B Complex Vitamins (VITAMIN B COMPLEX PO) Vitamin B complex     Biotin 10 MG TABS Take 1,000 mg by mouth. Once daily     Cholecalciferol (VITAMIN D-3 PO)      Cranberry-Vit C-Lactobacillus (RA CRANBERRY SUPPLEMENTS PO) Cranberry supplement     doxycycline (VIBRAMYCIN) 100 MG capsule Take 100 mg by mouth daily.     Evening Primrose Oil 1000 MG CAPS Take by mouth.     fluticasone  (FLONASE ) 50 MCG/ACT nasal spray Place 1 spray into both nostrils daily. 16 g 1   glucose blood (ONE TOUCH ULTRA TEST) test strip 1 each by Other route 2 (two) times daily. 100 each 3   Multiple Vitamins-Minerals (B COMPLEX PLUS VITAMIN C PO)      niacin  (NIASPAN ) 1000 MG CR tablet TAKE 1 TABLET (1,000 MG TOTAL) BY MOUTH AT BEDTIME. 90 tablet 1   nystatin-triamcinolone (MYCOLOG II) cream   0   ONE TOUCH LANCETS MISC 1 each by Does not apply route 2 (two) times daily. 200 each 1   spironolactone (ALDACTONE) 50 MG tablet Take 50 mg by mouth daily.     tirzepatide  (MOUNJARO ) 12.5 MG/0.5ML Pen Inject 12.5 mg into the skin once a week. 6 mL 4   Turmeric 500 MG CAPS Take by mouth.     vitamin B-12 (CYANOCOBALAMIN) 1000 MCG tablet Take 1,000 mcg by mouth daily.     vitamin E 180 MG (400 UNITS) capsule Take 400 Units by mouth daily.     atorvastatin  (LIPITOR) 10 MG tablet Take 1 tablet (10 mg total) by mouth daily. 90 tablet  3   No current facility-administered medications for this visit.    PHYSICAL EXAM: Vitals:   04/25/24 1351  BP: 104/70  Weight: 223 lb (101.2 kg)  Height: 5' 8 (1.727 m)   Body mass index is 33.91 kg/m.  Wt Readings from Last 3 Encounters:  04/25/24 223 lb (101.2 kg)  03/04/24 230 lb (104.3 kg)  03/16/23 236 lb (107 kg)    General: Well developed, well nourished female in no apparent distress.  HEENT: AT/[place], no external lesions.  Eyes: Conjunctiva clear and no icterus. Neck: Neck supple. Thyromegaly + 2-3 X normal, no  tender, b/l ? palpable thyroid  nodules.  Lungs: Respirations not labored Neurologic: Alert, oriented, normal speech Extremities / Skin: Dry.  Psychiatric: Does not appear depressed or anxious   Diabetic Foot Exam - Simple   Simple Foot Form Diabetic Foot exam was performed with the following findings: Yes 04/25/2024  2:28 PM  Visual Inspection No deformities, no ulcerations, no other skin breakdown bilaterally: Yes Sensation Testing Intact to touch and monofilament testing bilaterally: Yes Pulse Check Posterior Tibialis and Dorsalis pulse intact bilaterally: Yes Comments     LABS Reviewed Lab Results  Component Value Date   HGBA1C 5.9 (A) 04/25/2024   HGBA1C 6.4 03/09/2023   HGBA1C 6.3 09/01/2022   No results found for: FRUCTOSAMINE Lab Results  Component Value Date   CHOL 145 03/09/2023   HDL 44.30 03/09/2023   LDLCALC 77 03/09/2023   TRIG 118.0 03/09/2023   CHOLHDL 3 03/09/2023   Lab Results  Component Value Date   MICRALBCREAT 1.6 03/09/2023   MICRALBCREAT 1.2 03/03/2022   Lab Results  Component Value Date   CREATININE 0.87 03/09/2023   Lab Results  Component Value Date   GFR 72.30 03/09/2023    ASSESSMENT / PLAN  1. Controlled type 2 diabetes mellitus without complication, without long-term current use of insulin  (HCC)   2. Thyromegaly   3. Mixed hyperlipidemia     Diabetes Mellitus type 2, complicated by no known  complications. - Diabetic status / severity: Controlled.  Lab Results  Component Value Date   HGBA1C 5.9 (A) 04/25/2024    - Hemoglobin A1c goal : <6.5%  Will gradually increase Mounjaro  to help weight loss benefit.  She has controlled type 2 diabetes mellitus.   - Medications: See below.  I) increased Mounjaro  from 10 to 12.5 mg weekly.  - Home glucose testing: Advised to check blood sugar few times a week at different times of the day.  She has test supplies and glucometer at home. - Discussed/ Gave Hypoglycemia treatment plan. # Consult : not required at this time.   # Annual urine for microalbuminuria/ creatinine ratio, no microalbuminuria currently, will check today. Last  Lab Results  Component Value Date   MICRALBCREAT 1.6 03/09/2023    # Foot check nightly / neuropathy.  Diabetic foot exam normal in the clinic today.  # Annual dilated diabetic eye exams.   - Diet: Make healthy diabetic food choices - Life style / activity / exercise: Discussed.  2. Blood pressure  -  BP Readings from Last 1 Encounters:  04/25/24 104/70    - Control is in target.  - No change in current plans.  3. Lipid status / Hyperlipidemia - Last  Lab Results  Component Value Date   LDLCALC 77 03/09/2023   - Continue atorvastatin  10 mg daily.  # Thyromegaly - Thyromegaly was noted during CT scan neck in October 2015.  No records of ultrasound thyroid  done in the past available to review.  She is euthyroid not on thyroid  medication.  Denies any neck compressive symptoms. - Will check thyroid  function test today. - Check ultrasound thyroid .  Diagnoses and all orders for this visit:  Controlled type 2 diabetes mellitus without complication, without long-term  current use of insulin  (HCC) -     POCT glycosylated hemoglobin (Hb A1C) -     Lipid panel -     Basic metabolic panel with GFR -     Hemoglobin A1c -     Microalbumin / creatinine urine ratio  Thyromegaly -     T4,  free -     TSH -     US  THYROID ; Future  Mixed hyperlipidemia -     atorvastatin  (LIPITOR) 10 MG tablet; Take 1 tablet (10 mg total) by mouth daily.  Other orders -     tirzepatide  (MOUNJARO ) 12.5 MG/0.5ML Pen; Inject 12.5 mg into the skin once a week.   DISPOSITION Follow up in clinic in 6  months suggested.  Labs today as ordered.   All questions answered and patient verbalized understanding of the plan.  Alicia Hanson Medeiros, MD [facility] [address] [place], [address] [place], [place] [address] Phone # [phone]  At least part of this note was generated using voice recognition software. Inadvertent word errors may have occurred, which were not recognized during the proofreading process.

## 2024-04-26 LAB — BASIC METABOLIC PANEL WITH GFR
BUN: 14 mg/dL (ref 7–25)
CO2: 27 mmol/L (ref 20–32)
Calcium: 8.9 mg/dL (ref 8.6–10.4)
Chloride: 104 mmol/L (ref 98–110)
Creat: 0.96 mg/dL (ref 0.50–1.05)
Glucose, Bld: 94 mg/dL (ref 65–99)
Potassium: 4.3 mmol/L (ref 3.5–5.3)
Sodium: 140 mmol/L (ref 135–146)
eGFR: 67 mL/min/{1.73_m2} (ref >=60)

## 2024-04-26 LAB — LIPID PANEL
Cholesterol: 149 mg/dL (ref <200)
HDL: 58 mg/dL (ref >=50)
LDL Cholesterol (Calc): 72 mg/dL
Non-HDL Cholesterol (Calc): 91 mg/dL (ref <130)
Total CHOL/HDL Ratio: 2.6 (calc) (ref <5.0)
Triglycerides: 102 mg/dL (ref <150)

## 2024-04-26 LAB — MICROALBUMIN / CREATININE URINE RATIO
Creatinine, Urine: 222 mg/dL (ref 20–275)
Microalb Creat Ratio: 6 mg/g{creat} (ref <30)
Microalb, Ur: 1.3 mg/dL

## 2024-04-26 LAB — T4, FREE: Free T4: 1.2 ng/dL (ref 0.8–1.8)

## 2024-04-26 LAB — TSH: TSH: 0.69 m[IU]/L (ref 0.40–4.50)

## 2024-04-26 LAB — HEMOGLOBIN A1C
Hgb A1c MFr Bld: 6.1 % — ABNORMAL HIGH (ref <5.7)
Mean Plasma Glucose: 128 mg/dL
eAG (mmol/L): 7.1 mmol/L

## 2024-05-09 ENCOUNTER — Ambulatory Visit
Admission: RE | Admit: 2024-05-09 | Discharge: 2024-05-09 | Disposition: A | Discharge status: Home / Self Care | Source: Ambulatory Visit | Attending: Endocrinology

## 2024-05-09 DIAGNOSIS — E010 Iodine-deficiency related diffuse (endemic) goiter: Secondary | ICD-10-CM

## 2024-05-24 ENCOUNTER — Other Ambulatory Visit: Payer: Self-pay | Admitting: Internal Medicine

## 2024-05-24 DIAGNOSIS — E042 Nontoxic multinodular goiter: Secondary | ICD-10-CM

## 2024-06-13 ENCOUNTER — Other Ambulatory Visit (HOSPITAL_COMMUNITY)
Admission: RE | Admit: 2024-06-13 | Discharge: 2024-06-13 | Disposition: A | Discharge status: Home / Self Care | Source: Ambulatory Visit | Attending: Radiology | Admitting: Radiology

## 2024-06-13 ENCOUNTER — Ambulatory Visit
Admission: RE | Admit: 2024-06-13 | Discharge: 2024-06-13 | Disposition: A | Discharge status: Home / Self Care | Source: Ambulatory Visit | Attending: Internal Medicine | Admitting: Internal Medicine

## 2024-06-13 DIAGNOSIS — E042 Nontoxic multinodular goiter: Secondary | ICD-10-CM

## 2024-06-15 LAB — CYTOLOGY - NON PAP

## 2024-06-16 ENCOUNTER — Ambulatory Visit: Payer: Self-pay | Admitting: Endocrinology

## 2024-09-01 ENCOUNTER — Other Ambulatory Visit: Payer: Self-pay | Admitting: Endocrinology

## 2024-09-01 DIAGNOSIS — E782 Mixed hyperlipidemia: Secondary | ICD-10-CM

## 2024-10-24 ENCOUNTER — Ambulatory Visit: Admitting: Endocrinology

## 2024-10-24 ENCOUNTER — Encounter: Payer: Self-pay | Admitting: Endocrinology

## 2024-10-24 ENCOUNTER — Ambulatory Visit: Payer: Self-pay | Admitting: Endocrinology

## 2024-10-24 VITALS — BP 136/70 | HR 72 | Resp 16 | Ht 68.0 in | Wt 218.4 lb

## 2024-10-24 DIAGNOSIS — E042 Nontoxic multinodular goiter: Secondary | ICD-10-CM | POA: Diagnosis not present

## 2024-10-24 DIAGNOSIS — E119 Type 2 diabetes mellitus without complications: Secondary | ICD-10-CM | POA: Diagnosis not present

## 2024-10-24 DIAGNOSIS — Z7985 Long-term (current) use of injectable non-insulin antidiabetic drugs: Secondary | ICD-10-CM | POA: Diagnosis not present

## 2024-10-24 LAB — POCT GLYCOSYLATED HEMOGLOBIN (HGB A1C): Hemoglobin A1C: 6 % — AB (ref 4.0–5.6)

## 2024-10-24 MED ORDER — TIRZEPATIDE 15 MG/0.5ML ~~LOC~~ SOAJ: 15.0000 mg | SUBCUTANEOUS | 4 refills | Status: AC

## 2024-10-24 NOTE — Progress Notes (Signed)
 Outpatient Endocrinology Note Alicia Daubenspeck, MD   Patient's Name: Alicia Murray    DOB: 1961/09/01    MRN: [id number]                                                    REASON OF VISIT: Follow up for type 2 diabetes mellitus / multiple thyroid  nodules  PCP: Alicia Nell SAILOR, FNP  HISTORY OF PRESENT ILLNESS:   Alicia Murray is a 64 y.o. old female with past medical history listed below, is here for follow up for type 2 diabetes mellitus / multiple thyroid  nodules  Pertinent Diabetes History: Patient was previously and last time seen by Dr. Von in May 2024.  Patient was diagnosed with type 2 diabetes mellitus in 2007.  She was initially treated with insulin  and metformin.  GLP-1 receptor agonist started in 2014, was started initially with Victoza , she was on Ozempic  started in 2020 and later switched to Mounjaro .  No personal history of pancreatitis and / or family history of medullary thyroid  carcinoma or MEN 2B syndrome.   Chronic Diabetes Complications : Retinopathy: no. Last ophthalmology exam was done on annually, following with ophthalmology regularly.  Nephropathy: no Peripheral neuropathy: no Coronary artery disease: no Stroke: no  Relevant comorbidities and cardiovascular risk factors: Obesity: yes Body mass index is 33.21 kg/m.  Hypertension: Yes  Hyperlipidemia : Yes, on statin   Current / Home Diabetic regimen includes:  Mounjaro  12.5 mg weekly.   Prior diabetic medications: Ozempic , Victoza , metformin and insulin  in the past.  Glycemic data:   She has not been monitoring blood sugar at home.  Hypoglycemia: Patient has no hypoglycemic episodes. Patient has hypoglycemia awareness.  Factors modifying glucose control: 1.  Diabetic diet assessment: 3 meals a day.  2.  Staying active or exercising: No formal exercise.  3.  Medication compliance: compliant all of the time.  # Multiple thyroid  nodules /multinodular goiter: Patient had CT neck  for evaluation of neck stiffness seen October 2015 showed thyromegaly without dominant thyroid  nodule.  No ultrasound thyroid  records available to review.  Patient has no neck compressive symptoms.  She is euthyroid not on thyroid  medication.  It was being monitored conservatively /clinically in the past. - Ultrasound thyroid  in June 2025 : Enlarged heterogeneous diffuse multinodular goiter with multiple right-sided nodules measuring 1.6 to 2.1 cm and multiple left-sided thyroid  nodules including left inferior hypoechoic nodule measuring 1.9 cm. - Patient underwent FNA of left inferior thyroid  nodule in August 2025 with benign cytology. FINAL MICROSCOPIC DIAGNOSIS:  - Benign follicular nodule (Bethesda category II)  - Patient is euthyroid not on thyroid  medication.  No family history of thyroid  cancer.  Interval history  Hemoglobin A1c today 6%.  She has been taking Mounjaro .  She lost about 5 pounds of weight since June.  No glucose data to review.  Denies neck compressive symptoms, neck discomfort or enlarging goiter.  No numbness and ting of the feet.  No vision problem.  No hypo and hyperthyroid symptoms.  No other complaints today.  Patient had ultrasound thyroid  in FNA of left inferior thyroid  nodule in August with benign cytology.  Reviewed with the patient in the clinic today.   REVIEW OF SYSTEMS As per history of present illness.   PAST MEDICAL HISTORY: Past Medical History:  Diagnosis Date   Bladder infection  Diabetes mellitus    Fibroid    H/O varicella    History of measles, mumps, or rubella    Infertility    Monilia infection 07/10/08   Thrombosed external hemorrhoid 08/15/85   Thyroid  enlargement    Vagina itching 07/10/08   Vulvitis 08/01/08    PAST SURGICAL HISTORY: Past Surgical History:  Procedure Laterality Date   BREAST BIOPSY Left 08/08/2013   MYOMECTOMY ABDOMINAL APPROACH  1977    ALLERGIES: No Known Allergies  FAMILY HISTORY:  Family History  Problem  Relation Age of Onset   Cancer Mother        Breast   Breast cancer Mother    Cancer Father        Brain   Diabetes Father    Diabetes Brother    Hypertension Brother     SOCIAL HISTORY: Social History   Socioeconomic History   Marital status: Married    Spouse name: Not on file   Number of children: Not on file   Years of education: Not on file   Highest education level: Not on file  Occupational History   Not on file  Tobacco Use   Smoking status: Former    Types: Cigarettes   Smokeless tobacco: Never  Vaping Use   Vaping status: Never Used  Substance and Sexual Activity   Alcohol use: Not Currently   Drug use: Never   Sexual activity: Not Currently    Birth control/protection: None  Other Topics Concern   Not on file  Social History Narrative   Not on file   Social Drivers of Health   Tobacco Use: Medium Risk (10/24/2024)   Patient History    Smoking Tobacco Use: Former    Smokeless Tobacco Use: Never    Passive Exposure: Not on Actuary Strain: Not on file  Food Insecurity: Not on file  Transportation Needs: Not on file  Physical Activity: Not on file  Stress: Not on file  Social Connections: Not on file  Depression (EYV7-0): Not on file  Alcohol Screen: Not on file  Housing: Not on file  Utilities: Not on file  Health Literacy: Not on file    MEDICATIONS:  Current Outpatient Medications  Medication Sig Dispense Refill   tirzepatide  (MOUNJARO ) 15 MG/0.5ML Pen Inject 15 mg into the skin once a week. 6 mL 4   APPLE CIDER VINEGAR PO Take by mouth.     aspirin 325 MG tablet Take 325 mg by mouth daily.     atorvastatin  (LIPITOR) 10 MG tablet Take 1 tablet (10 mg total) by mouth daily. 90 tablet 3   B Complex Vitamins (VITAMIN B COMPLEX PO) Vitamin B complex     Biotin 10 MG TABS Take 1,000 mg by mouth. Once daily     Cholecalciferol (VITAMIN D-3 PO)      Cranberry-Vit C-Lactobacillus (RA CRANBERRY SUPPLEMENTS PO) Cranberry supplement      doxycycline (VIBRAMYCIN) 100 MG capsule Take 100 mg by mouth daily.     Evening Primrose Oil 1000 MG CAPS Take by mouth.     fluticasone  (FLONASE ) 50 MCG/ACT nasal spray Place 1 spray into both nostrils daily. 16 g 1   glucose blood (ONE TOUCH ULTRA TEST) test strip 1 each by Other route 2 (two) times daily. 100 each 3   Multiple Vitamins-Minerals (B COMPLEX PLUS VITAMIN C PO)      niacin  (NIASPAN ) 1000 MG CR tablet TAKE 1 TABLET (1,000 MG TOTAL) BY MOUTH AT BEDTIME. 90  tablet 1   nystatin-triamcinolone (MYCOLOG II) cream   0   ONE TOUCH LANCETS MISC 1 each by Does not apply route 2 (two) times daily. 200 each 1   spironolactone (ALDACTONE) 50 MG tablet Take 50 mg by mouth daily.     Turmeric 500 MG CAPS Take by mouth.     vitamin B-12 (CYANOCOBALAMIN) 1000 MCG tablet Take 1,000 mcg by mouth daily.     vitamin E 180 MG (400 UNITS) capsule Take 400 Units by mouth daily.     No current facility-administered medications for this visit.    PHYSICAL EXAM: Vitals:   10/24/24 1308  BP: 136/70  Pulse: 72  Resp: 16  SpO2: 96%  Weight: 218 lb 6.4 oz (99.1 kg)  Height: 5' 8 (1.727 m)   Body mass index is 33.21 kg/m.  Wt Readings from Last 3 Encounters:  10/24/24 218 lb 6.4 oz (99.1 kg)  04/25/24 223 lb (101.2 kg)  03/04/24 230 lb (104.3 kg)    General: Well developed, well nourished female in no apparent distress.  HEENT: AT/[place], no external lesions.  Eyes: Conjunctiva clear and no icterus. Neck: Neck supple. Thyromegaly + 2-3 X normal, no  tender, b/l ? palpable thyroid  nodules.  Lungs: Respirations not labored Neurologic: Alert, oriented, normal speech Extremities / Skin: Dry.  Psychiatric: Does not appear depressed or anxious   Diabetic Foot Exam - Simple   No data filed     LABS Reviewed Lab Results  Component Value Date   HGBA1C 6.0 (A) 10/24/2024   HGBA1C 6.1 (H) 04/25/2024   HGBA1C 5.9 (A) 04/25/2024   No results found for: FRUCTOSAMINE Lab Results   Component Value Date   CHOL 149 04/25/2024   HDL 58 04/25/2024   LDLCALC 72 04/25/2024   TRIG 102 04/25/2024   CHOLHDL 2.6 04/25/2024   Lab Results  Component Value Date   MICRALBCREAT 6 04/25/2024   Lab Results  Component Value Date   CREATININE 0.96 04/25/2024   Lab Results  Component Value Date   GFR 72.30 03/09/2023    ASSESSMENT / PLAN  1. Controlled type 2 diabetes mellitus without complication, without long-term current use of insulin  (HCC)   2. Multinodular thyroid      Diabetes Mellitus type 2, complicated by no known complications. - Diabetic status / severity: Controlled.  Lab Results  Component Value Date   HGBA1C 6.0 (A) 10/24/2024    - Hemoglobin A1c goal : <6.5%  - Medications: See below.  I) increased Mounjaro  from 12.5 mg to 15 mg weekly.  - Home glucose testing: Advised to check blood sugar few times a week at different times of the day.  She has test supplies and glucometer at home.  - Discussed/ Gave Hypoglycemia treatment plan. # Consult : not required at this time.   # Annual urine for microalbuminuria/ creatinine ratio, no microalbuminuria currently. Last  Lab Results  Component Value Date   MICRALBCREAT 6 04/25/2024    # Foot check nightly / neuropathy.  Diabetic foot exam normal in the clinic today.  # Annual dilated diabetic eye exams.   - Diet: Make healthy diabetic food choices - Life style / activity / exercise: Discussed.  2. Blood pressure  -  BP Readings from Last 1 Encounters:  10/24/24 136/70    - Control is in target.  - No change in current plans.  3. Lipid status / Hyperlipidemia - Last  Lab Results  Component Value Date   LDLCALC 72 04/25/2024   -  Continue atorvastatin  10 mg daily.  # Multiple thyroid  nodules - Thyromegaly was noted during CT scan neck in October 2015.  No records of ultrasound thyroid  done in the past available to review.  She is euthyroid, not on thyroid  medication.  Denies any neck  compressive symptoms. -Ultrasound thyroid  in June 2025 showed bilateral thyroid  nodules mostly with no worrisome features.  Status post left inferior thyroid  nodule measuring 1.9 cm FNA with benign cytology. -Will repeat ultrasound thyroid  prior to follow-up visit in 6 months to monitor thyroid  nodules.  Diagnoses and all orders for this visit:  Controlled type 2 diabetes mellitus without complication, without long-term current use of insulin  (HCC) -     POCT glycosylated hemoglobin (Hb A1C) -     tirzepatide  (MOUNJARO ) 15 MG/0.5ML Pen; Inject 15 mg into the skin once a week. -     Basic metabolic panel with GFR -     Hemoglobin A1c -     Microalbumin / creatinine urine ratio -     Lipid panel  Multinodular thyroid  -     US  THYROID ; Future -     T4, free -     TSH   DISPOSITION Follow up in clinic in 6  months suggested.  Labs prior to follow-up visit as ordered.  All questions answered and patient verbalized understanding of the plan.  Albaraa Swingle, MD [facility] Endocrinology [facility] [address] [place], [address] [place], [place] [address] Phone # [phone]  At least part of this note was generated using voice recognition software. Inadvertent word errors may have occurred, which were not recognized during the proofreading process.

## 2025-04-10 ENCOUNTER — Other Ambulatory Visit

## 2025-04-24 ENCOUNTER — Other Ambulatory Visit

## 2025-05-01 ENCOUNTER — Ambulatory Visit: Admitting: Endocrinology
